# Patient Record
Sex: Male | Born: 1940 | Race: Black or African American | Hispanic: No | Smoking: Former smoker
Health system: Southern US, Community
[De-identification: ages and names within clinical notes are randomized; demographics above are authoritative.]

## PROBLEM LIST (undated history)

## (undated) DIAGNOSIS — I10 Essential (primary) hypertension: Secondary | ICD-10-CM

## (undated) HISTORY — DX: Essential (primary) hypertension: I10

---

## 1998-05-31 HISTORY — PX: BRAIN SURGERY: SHX531

## 2008-05-31 DIAGNOSIS — C61 Malignant neoplasm of prostate: Secondary | ICD-10-CM | POA: Insufficient documentation

## 2008-05-31 DIAGNOSIS — R351 Nocturia: Secondary | ICD-10-CM | POA: Insufficient documentation

## 2008-05-31 DIAGNOSIS — R3912 Poor urinary stream: Secondary | ICD-10-CM | POA: Insufficient documentation

## 2008-05-31 HISTORY — DX: Malignant neoplasm of prostate: C61

## 2017-05-31 HISTORY — PX: CATARACT EXTRACTION: SUR2

## 2019-05-11 ENCOUNTER — Ambulatory Visit (INDEPENDENT_AMBULATORY_CARE_PROVIDER_SITE_OTHER): Payer: Medicare Other | Admitting: Family Medicine

## 2019-05-11 ENCOUNTER — Encounter (INDEPENDENT_AMBULATORY_CARE_PROVIDER_SITE_OTHER): Payer: Self-pay | Admitting: Family Medicine

## 2019-05-11 VITALS — BP 121/73 | HR 88 | Temp 97.2°F | Ht 64.8 in | Wt 167.4 lb

## 2019-05-11 DIAGNOSIS — R5383 Other fatigue: Secondary | ICD-10-CM

## 2019-05-11 DIAGNOSIS — Z8546 Personal history of malignant neoplasm of prostate: Secondary | ICD-10-CM

## 2019-05-11 DIAGNOSIS — Z1211 Encounter for screening for malignant neoplasm of colon: Secondary | ICD-10-CM

## 2019-05-11 DIAGNOSIS — H9193 Unspecified hearing loss, bilateral: Secondary | ICD-10-CM

## 2019-05-11 DIAGNOSIS — Z1212 Encounter for screening for malignant neoplasm of rectum: Secondary | ICD-10-CM

## 2019-05-11 DIAGNOSIS — R2689 Other abnormalities of gait and mobility: Secondary | ICD-10-CM

## 2019-05-11 DIAGNOSIS — E559 Vitamin D deficiency, unspecified: Secondary | ICD-10-CM

## 2019-05-11 DIAGNOSIS — Z23 Encounter for immunization: Secondary | ICD-10-CM

## 2019-05-11 DIAGNOSIS — I10 Essential (primary) hypertension: Secondary | ICD-10-CM

## 2019-05-11 DIAGNOSIS — H259 Unspecified age-related cataract: Secondary | ICD-10-CM

## 2019-05-11 DIAGNOSIS — R739 Hyperglycemia, unspecified: Secondary | ICD-10-CM

## 2019-05-11 LAB — CBC AND DIFFERENTIAL
Absolute NRBC: 0 10*3/uL (ref 0.00–0.00)
Basophils Absolute Automated: 0.03 10*3/uL (ref 0.00–0.08)
Basophils Automated: 0.7 %
Eosinophils Absolute Automated: 0.05 10*3/uL (ref 0.00–0.44)
Eosinophils Automated: 1.1 %
Hematocrit: 41.2 % (ref 37.6–49.6)
Hgb: 13.3 g/dL (ref 12.5–17.1)
Immature Granulocytes Absolute: 0.02 10*3/uL (ref 0.00–0.07)
Immature Granulocytes: 0.4 %
Lymphocytes Absolute Automated: 1.8 10*3/uL (ref 0.42–3.22)
Lymphocytes Automated: 39.5 %
MCH: 30.2 pg (ref 25.1–33.5)
MCHC: 32.3 g/dL (ref 31.5–35.8)
MCV: 93.4 fL (ref 78.0–96.0)
MPV: 9.2 fL (ref 8.9–12.5)
Monocytes Absolute Automated: 0.48 10*3/uL (ref 0.21–0.85)
Monocytes: 10.5 %
Neutrophils Absolute: 2.18 10*3/uL (ref 1.10–6.33)
Neutrophils: 47.8 %
Nucleated RBC: 0 /100 WBC (ref 0.0–0.0)
Platelets: 228 10*3/uL (ref 142–346)
RBC: 4.41 10*6/uL (ref 4.20–5.90)
RDW: 13 % (ref 11–15)
WBC: 4.56 10*3/uL (ref 3.10–9.50)

## 2019-05-11 LAB — COMPREHENSIVE METABOLIC PANEL
ALT: 22 U/L (ref 0–55)
AST (SGOT): 24 U/L (ref 5–34)
Albumin/Globulin Ratio: 1.2 (ref 0.9–2.2)
Albumin: 4.1 g/dL (ref 3.5–5.0)
Alkaline Phosphatase: 50 U/L (ref 38–106)
Anion Gap: 11 (ref 5.0–15.0)
BUN: 21 mg/dL (ref 9.0–28.0)
Bilirubin, Total: 0.9 mg/dL (ref 0.2–1.2)
CO2: 24 mEq/L (ref 21–29)
Calcium: 9.3 mg/dL (ref 7.9–10.2)
Chloride: 102 mEq/L (ref 100–111)
Creatinine: 1.7 mg/dL — ABNORMAL HIGH (ref 0.5–1.5)
Globulin: 3.5 g/dL (ref 2.0–3.7)
Glucose: 100 mg/dL (ref 70–100)
Potassium: 3.8 mEq/L (ref 3.5–5.1)
Protein, Total: 7.6 g/dL (ref 6.0–8.3)
Sodium: 137 mEq/L (ref 136–145)

## 2019-05-11 LAB — HEMOGLOBIN A1C
Average Estimated Glucose: 119.8 mg/dL
Hemoglobin A1C: 5.8 % (ref 4.6–5.9)

## 2019-05-11 LAB — TSH: TSH: 0.23 u[IU]/mL — ABNORMAL LOW (ref 0.35–4.94)

## 2019-05-11 LAB — PSA: Prostate Specific Antigen, Total: 3.217 ng/mL (ref 0.000–4.000)

## 2019-05-11 LAB — HEMOLYSIS INDEX: Hemolysis Index: 14 (ref 0–18)

## 2019-05-11 LAB — GFR: EGFR: 39.1

## 2019-05-11 LAB — VITAMIN D,25 OH,TOTAL: Vitamin D, 25 OH, Total: 70 ng/mL (ref 30–100)

## 2019-05-11 LAB — VITAMIN B12: Vitamin B-12: 580 pg/mL (ref 211–911)

## 2019-05-11 NOTE — Progress Notes (Signed)
Have you seen any specialists/other providers since your last visit with [place]?    No    Do you agree to telemedicine visit?  No    Arm preference verified?   Yes, no preference    Health Maintenance Due   Topic Date Due   . Pneumonia Vaccine Age 78+ (1 of 2 - PCV13) 07/28/2005

## 2019-05-11 NOTE — Progress Notes (Signed)
Subjective:      Date: 05/11/2019 3:40 PM   Patient ID: Tanner Lewis is a 78 y.o. male.    Chief Complaint:  Chief Complaint   Patient presents with   . Establish Care     Needs a referral for colonoscopy, Last Colonospy: Pt. stated it was more than 10 years ago.   . Fatigue     x3 months.   . Polyuria     Frequent urination at night.Pt. denies excessive thrist and weight loss.   [name] Hearing Problem     Pt. would like an OTC medication that can aid with hearing.   . Establish Care     Needs a referral for urologist.       HPI:  HPI     Pt is a 78 years old male who presents to establish care. His past medical history if significant for HTN, prostate cancer ( status post treatment), balance difficulty.    Today he complains for polyuria, with increase thrist and weight loss.     Also complains of recent difficulties with hearing.     Complaints of fatigue today. Feels like he does not have the energy to do his daily activities as he use to.     Problem List:  Patient Active Problem List   Diagnosis   . Essential hypertension   . History of prostate cancer   . Fatigue, unspecified type   . Keeps losing balance   . Age-related cataract of both eyes, unspecified age-related cataract type   . 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 amlodipine-benazepril (LOTREL) 10-20 MG per capsule Take 1 capsule by mouth daily Take one tablet by mouth once daily.     [name] Apoaequorin (Prevagen) 10 MG Cap Take by mouth One capsule once daily.     . hydrALAZINE (APRESOLINE) 50 MG tablet Take 50 mg by mouth 3 (three) times daily Take 1 tablet by mouth three times daily     . isosorbide dinitrate (ISORDIL) 20 MG tablet Take 20 mg by mouth 3 (three) times daily Take 1 tablet by mouth three times daily.     . Misc Natural Products (PROSTATE SUPPORT PO) Take by mouth Twice daily.     [name] oxybutynin (DITROPAN) 5 MG tablet Take 5 mg by mouth 3 (three)  times daily Take one tablet by mouth once dauly     . simvastatin (ZOCOR) 20 MG tablet Take 20 mg by mouth nightly Take one tablet by mouth at bedtime     . tamsulosin (FLOMAX) 0.4 MG Cap Take 0.4 mg by mouth Daily after dinner Take 1 capsule by mouth daily.     [name] venlafaxine (EFFEXOR) 75 MG tablet Take 75 mg by mouth 2 (two) times daily Take 1 tablet by mouth at bedtime.       No current facility-administered medications for this visit.        Allergies:  Allergies   Allergen Reactions   . Solarcaine [Benzocaine] Rash     Happened over 5 years ago.       Past Medical History:  Past Medical History:   Diagnosis Date   . Hypertension    . Prostate cancer 2010       ROS:  Review of Systems   ROS:  General/Constitutional:           Denies Chills.  Denies  Fatigue.  Denies Fever.           ENT:           Positive for decrease hearing  Endocrine:           Positive for polyuria,   Respiratory:           Denies Cough.  Denies Orthopnea.  Denies Shortness of breath.  Denies Wheezing.       Cardiovascular:           Denies Chest pain.  Denies Chest pain with exertion.  Denies Palpitations.  Denies Swelling in hands/feet.       Genitourinary:           Denies Blood in urine.  Denies Painful urination.  Positive for frequency  Peripheral Vascular:           Denies Pain/cramping in legs after exertion.  Denies Painful extremities.       MSK:         Denies: Joint pain, weakness, falls, muscle pain. Admits to poor balance    Objective:     Vitals:  BP 121/73 (BP Site: Left arm, Patient Position: Sitting, Cuff Size: Medium)   Pulse 88   Temp 97.2 F (36.2 C) (Temporal)   Ht 1.646 m (5' 4.8")   Wt 75.9 kg (167 lb 6.4 oz)   BMI 28.03 kg/m     Physical Exam:  Physical Exam    General Examination:  GENERAL APPEARANCE: alert, in no acute distress, well developed, well nourished, oriented to time, place, and person. Elderly gentleman accompanied by his daughter  HEAD: normal appearance.   EYES: extraocular movement intact (EOMI)    EARS: tympanic membranes normal bilaterally, external canals normal .   NOSE: normal nasal mucosa, nares patent.   ORAL CAVITY: normal oropharynx.   HEART: S1, S2 normal, no murmurs, rubs, gallops, regular rate and rhythm.   LUNGS: normal effort / no distress, normal breath sounds, clear to auscultation bilaterally, no wheezes, rales, rhonchi.   ABDOMEN: bowel sounds present, no hepatosplenomegaly, soft, nontender,  EXTREMITIES: no clubbing, cyanosis, or edema.   MSK: Muscle strength 5/5  Neuro: No deficits noted      Assessment/Plan:   1. Essential hypertension  -New to me, but reports chronic medical condition. Will continue to monitor    2. Screening for colorectal cancer  -Age approciate screening. Discussed risked and benefits given advance age. He will like to proceed with colonscopy and intervention as needed  - Gastroenterology Referral: Joanne Gavel, MD [facility]. [facility] [place])    3. History of prostate cancer  - Status post prostate seed treatment.   - PSA  - Urology Referral: Madlyn Frankel, MD [facility])    4. Fatigue, unspecified type  - unknown etiology, investigate with labs below.   - Comprehensive metabolic panel; Future  - TSH; Future  - CBC and differential; Future  - Testosterone Free and Total  - Comprehensive metabolic panel  - TSH  - CBC and differential    5. Keeps losing balance  - Poor coordination with neuro defits. Refer to PT for balance  - Vitamin B12  - Ambulatory referral to Physical Therapy    6. Age-related cataract of both eyes, unspecified age-related cataract type  - Reported poor vision. Ophthalmology for cataract treatment  - Ophthalmology Referral: Tawni Millers, MD [facility])    7. Vitamin D deficiency  - Prior history of. Repeat labs today  - Vitamin D,25 OH, Total; Future  - Vitamin  D,25 [place], Total    8. Hyperglycemia  -Screen for DM due to hypergylcemia on labs  - Hemoglobin A1C    9. Encounter for vaccination  Age appropiate vaccination  - Pneumococcal conjugate vaccine  13-valent  - Zoster Vaccine Recomb,Adjuvanted (IM)    10. 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

## 2019-05-14 ENCOUNTER — Encounter (INDEPENDENT_AMBULATORY_CARE_PROVIDER_SITE_OTHER): Payer: Self-pay | Admitting: Cardiovascular Disease

## 2019-05-14 ENCOUNTER — Ambulatory Visit (INDEPENDENT_AMBULATORY_CARE_PROVIDER_SITE_OTHER): Payer: Medicare Other | Admitting: Cardiovascular Disease

## 2019-05-14 VITALS — BP 143/84 | HR 69 | Temp 96.4°F | Resp 19 | Ht 64.0 in | Wt 171.0 lb

## 2019-05-14 DIAGNOSIS — I10 Essential (primary) hypertension: Secondary | ICD-10-CM

## 2019-05-14 NOTE — Progress Notes (Signed)
[place] [facility] Consult      Assessment and Plan     78 yo male relocated to [place] from [place] and here to establish care.      1. Hypertension  2.  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  No issues since.  3. Prostate cancer -- he was treated with radiation, 10 years ago treated; follows with urology  4. CKD Cr 1.7 (taken off of Lisinopril as a result by PCP)    Recommendations:  Please send records from [place]    History of Present Illness     [name] is a 78 year old gentleman who is living with his daughter who relocated to the area from [place] very recently and is looking to establish cardiovascular care.  He has a history of hypertension and prostate cancer.  He is on Lotrel 10-20 mg, hydralazine 50 mg 3 times daily, Isordil 20 mg 2 times daily, and he takes Zocor 20 mg once a day.  Blood pressure is a little elevated today.  He reports he underwent cardiovascular testing many years ago.  Seen by PCP recently (05/11/2019) and had labs drawn.  Recently, pt's Lisinopril was d/c'd presumably due to Cr of 1.7 without known baseline value.    He denies any cardiovascular symptoms today.      Exercise tolerance is quite good.  He could walk one mile.  He could walk one flight of steps.  Takes care of all activities of daily living.     No history of heart failure    No history of myocardial infarction.    EKG shows normal sinus rhythm normal intervals narrow QRS, no pathologic Q waves    He was seen last by cardiologist (daughter and pt do not remember name) in [place] in Sept 2020.  No issues at that visit.  He was taking isosorbide TID and taking now BID was the only change.  [name] is his daughter who helps with the history.    He reports a history of syncope about 3 months ago.  He was at [employer].  He looked "different" per his daughter.  EMS came and said his BP was low.  Event occurred after taking most of his BP meds and having a beer.   EKG was normal.  Was not taken to ER.  No further  events reported.  No prior events reported.    Denies PND, orthopnea, palpitations.    Denies claudication symptoms.      Past Medical History     Past Medical History:   Diagnosis Date   . Hypertension    . Prostate cancer 2010       Past Surgical History     Past Surgical History:   Procedure Laterality Date   . BRAIN SURGERY  2000   . CATARACT EXTRACTION  2019    Both eyes        Family History     Family History   Problem Relation Age of Onset   . Diabetes Mother    . Diabetes Brother    . Diabetes Son    . Glaucoma Son        Social History     Social History     Socioeconomic History   . Marital status: Single     Spouse name: Not on file   . Number of children: Not on file   . Years of education: Not on file   . Highest education level: Not on  file   Occupational History   . Not on file   Social Needs   . Financial resource strain: Not on file   . Food insecurity     Worry: Not on file     Inability: Not on file   . Transportation needs     Medical: Not on file     Non-medical: Not on file   Tobacco Use   . Smoking status: Former Smoker     Packs/day: 1.50     Years: 20.00     Pack years: 30.00     Types: Cigarettes     Quit date: 05/10/2014     Years since quitting: 5.0   . Smokeless tobacco: Never Used   Substance and Sexual Activity   . Alcohol use: Yes     Alcohol/week: 8.0 standard drinks     Types: 2 Glasses of wine, 3 Cans of beer, 3 Shots of liquor per week     Comment: Ocasionally, not done very often   . Drug use: Never   . Sexual activity: Not Currently     Partners: Female     Birth control/protection: Condom   Lifestyle   . Physical activity     Days per week: Not on file     Minutes per session: Not on file   . Stress: Not on file   Relationships   . Social Wellsite geologist on phone: Not on file     Gets together: Not on file     Attends religious service: Not on file     Active member of club or organization: Not on file     Attends meetings of clubs or organizations: Not on file      Relationship status: Not on file   . Intimate partner violence     Fear of current or ex partner: Not on file     Emotionally abused: Not on file     Physically abused: Not on file     Forced sexual activity: Not on file   Other Topics Concern   . Not on file   Social History Narrative   . Not on file       Allergies     Allergies   Allergen Reactions   . Solarcaine [Benzocaine] Rash     Happened over 5 years ago.       Medications     Current Outpatient Medications   Medication Sig Dispense Refill   . amlodipine-benazepril (LOTREL) 10-20 MG per capsule Take 1 capsule by mouth daily Take one tablet by mouth once daily.     [name] Apoaequorin (Prevagen) 10 MG Cap Take by mouth One capsule once daily.     . hydrALAZINE (APRESOLINE) 50 MG tablet Take 50 mg by mouth 3 (three) times daily Take 1 tablet by mouth three times daily     . isosorbide dinitrate (ISORDIL) 20 MG tablet Take 20 mg by mouth 3 (three) times daily Take 1 tablet by mouth three times daily.     . Misc Natural Products (PROSTATE SUPPORT PO) Take by mouth Twice daily.     [name] oxybutynin (DITROPAN) 5 MG tablet Take 5 mg by mouth 3 (three) times daily Take one tablet by mouth once dauly     . simvastatin (ZOCOR) 20 MG tablet Take 20 mg by mouth nightly Take one tablet by mouth at bedtime     . tamsulosin (FLOMAX) 0.4 MG Cap Take 0.4 mg by  mouth Daily after dinner Take 1 capsule by mouth daily.     [name] venlafaxine (EFFEXOR) 75 MG tablet Take 75 mg by mouth 2 (two) times daily Take 1 tablet by mouth at bedtime.       No current facility-administered medications for this visit.          Review of Systems     Constitutional: Negative for fevers and chills  Skin: No rash or lesions  Respiratory: Negative for cough, wheezing, or hemoptysis  Cardiovascular: as per HPI  Gastrointestinal: Negative for abdominal pain, nausea, vomiting and diarrhea  Musculoskeletal:  No arthritic symptoms  Genitourinary: Negative for dysuria  Otherwise 10 point review of systems is  negative.      Physical Exam   BP 143/84 (BP Site: Left arm, Patient Position: Sitting)   Pulse 69   Temp (!) 96.4 F (35.8 C)   Resp 19   Ht 1.626 m (5\' 4" )   Wt 77.6 kg (171 lb)   SpO2 98%   BMI 29.35 kg/m     There is no height or weight on file to calculate BMI.    General:  Patient appears their stated age, well-nourished.  Alert and in no apparent distress.  Eyes: No conjunctivitis, no purulent discharge, no lid lag  ENT:  Hearing grossly intact, Nares patent bilaterally, Lips moist, color appropriate for race.  Respiratory: Clear to auscultation and percussion throughout. Respiratory effort unlabored, chest expansion symmetric.    Cardio: Regular rate and rhythm. Normal S1/S2 No carotid bruits or thrills, no JVD.  Extremities: warm, pulses 2+, no peripheral edema  GI: Soft, nondistended, nontender.  No guarding or rebound.  Skin: Color appropriate for race, Skin warm, dry, and intact  Psychiatric: Good insight and judgment, oriented to person, place, and time    Labs     Lipid Panel No results found for: CHOL, TRIG, HDL  CBC   Lab Results   Component Value Date    WBC 4.56 05/11/2019    HGB 13.3 05/11/2019    HCT 41.2 05/11/2019    MCV 93.4 05/11/2019    PLT 228 05/11/2019      BMP  Lab Results   Component Value Date    CO2 24 05/11/2019    BUN 21.0 05/11/2019     INR No results found for: INR, PROTIME     EKG   I have reviewed and interpreted the EKG.  The EKG is significant for NSR, normal intervals.          I spent 45 mins with the patient, >50% of the time was spent on education and counseling.      Renard Hamper, MD, MS    [facility] Electrophysiology

## 2019-05-15 ENCOUNTER — Encounter (INDEPENDENT_AMBULATORY_CARE_PROVIDER_SITE_OTHER): Payer: Self-pay | Admitting: Cardiovascular Disease

## 2019-05-17 LAB — TESTOSTERONE, FREE, TOTAL
Free Testosterone: 1.68 ng/dL — ABNORMAL LOW (ref 3.08–11.3)
Total Testosterone: 129 ng/dL — ABNORMAL LOW (ref 240–950)

## 2019-05-21 ENCOUNTER — Encounter (INDEPENDENT_AMBULATORY_CARE_PROVIDER_SITE_OTHER): Payer: Self-pay | Admitting: Family Medicine

## 2019-05-21 DIAGNOSIS — R2689 Other abnormalities of gait and mobility: Secondary | ICD-10-CM | POA: Insufficient documentation

## 2019-05-21 DIAGNOSIS — H9193 Unspecified hearing loss, bilateral: Secondary | ICD-10-CM | POA: Insufficient documentation

## 2019-05-21 DIAGNOSIS — I10 Essential (primary) hypertension: Secondary | ICD-10-CM | POA: Insufficient documentation

## 2019-05-21 DIAGNOSIS — R5383 Other fatigue: Secondary | ICD-10-CM | POA: Insufficient documentation

## 2019-05-21 DIAGNOSIS — Z8546 Personal history of malignant neoplasm of prostate: Secondary | ICD-10-CM | POA: Insufficient documentation

## 2019-05-21 DIAGNOSIS — H259 Unspecified age-related cataract: Secondary | ICD-10-CM | POA: Insufficient documentation

## 2019-05-22 ENCOUNTER — Telehealth (INDEPENDENT_AMBULATORY_CARE_PROVIDER_SITE_OTHER): Payer: Medicare Other | Admitting: Family Medicine

## 2019-05-22 DIAGNOSIS — N1832 Chronic kidney disease, stage 3b: Secondary | ICD-10-CM

## 2019-05-22 DIAGNOSIS — E079 Disorder of thyroid, unspecified: Secondary | ICD-10-CM

## 2019-05-22 DIAGNOSIS — R7989 Other specified abnormal findings of blood chemistry: Secondary | ICD-10-CM

## 2019-05-22 DIAGNOSIS — R7303 Prediabetes: Secondary | ICD-10-CM

## 2019-05-22 DIAGNOSIS — R5383 Other fatigue: Secondary | ICD-10-CM

## 2019-05-22 NOTE — Progress Notes (Signed)
Subjective:      Patient ID: Tanner Lewis  is a 78 y.o.  male.     No chief complaint on file.       Verbal consent has been obtained from the patient to conduct a Video visit encounter to minimize exposure to COVID-19: yes    Telemedicine Documentation Requirements    Provider and Title: Elray Buba. D.O  Consent obtained: YES/NO: Yes  Language, if applicable and if translator was required: English      HPI   Presents for laboratory follow-up following his health maintenance examination.  Labs showed that he had low testosterone, low TSH, and hemoglobin A1c in the prediabetic range.  And a decreased GFR    Today he reports that he is doing well.  Still has mild fatigue.  He is currently in [place] celebrating Christmas with his family.  He will return to his area in 1 month.  Has no acute complaints.  Has been able to follow-up with hearing exam for his decreased hearing.  He also follows up with cardiology.  Pending ophthalmology visit for his cataracts.      The following portions of the patient's history were reviewed and updated as appropriate: current medications, allergies, past family history, past medical history, past social history, past surgical history and problem list.     Past Medical History:   Diagnosis Date   . Hypertension    . Prostate cancer 2010       Patient Active Problem List   Diagnosis   . Essential hypertension   . History of prostate cancer   . Fatigue, unspecified type   . Keeps losing balance   . Age-related cataract of both eyes, unspecified age-related cataract type   . 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 Solarcaine [Benzocaine] Rash     Happened over 5 years ago.         Current Outpatient Medications:   .  amlodipine-benazepril (LOTREL) 10-20 MG per capsule, Take 1 capsule by mouth daily Take one tablet by mouth once daily., Disp: , Rfl:   .  Apoaequorin (Prevagen) 10 MG Cap, Take by mouth One capsule once daily., Disp: ,  Rfl:   .  hydrALAZINE (APRESOLINE) 50 MG tablet, Take 50 mg by mouth 3 (three) times daily Take 1 tablet by mouth three times daily, Disp: , Rfl:   .  isosorbide dinitrate (ISORDIL) 20 MG tablet, Take 20 mg by mouth 3 (three) times daily Take 1 tablet by mouth three times daily., Disp: , Rfl:   .  Misc Natural Products (PROSTATE SUPPORT PO), Take by mouth Twice daily., Disp: , Rfl:   .  oxybutynin (DITROPAN) 5 MG tablet, Take 5 mg by mouth 3 (three) times daily Take one tablet by mouth once dauly, Disp: , Rfl:   .  simvastatin (ZOCOR) 20 MG tablet, Take 20 mg by mouth nightly Take one tablet by mouth at bedtime, Disp: , Rfl:   .  tamsulosin (FLOMAX) 0.4 MG Cap, Take 0.4 mg by mouth Daily after dinner Take 1 capsule by mouth daily., Disp: , Rfl:   .  venlafaxine (EFFEXOR) 75 MG tablet, Take 75 mg by mouth 2 (two) times daily Take 1 tablet by mouth at bedtime., Disp: , Rfl:     Review of Systems   Constitutional: Positive for fatigue. Negative for fever.   Respiratory: Negative for stridor.    Cardiovascular:  Negative for chest pain, palpitations and leg swelling.   Gastrointestinal: Negative for abdominal distention and abdominal pain.   Genitourinary: Negative for difficulty urinating.   Musculoskeletal: Negative for arthralgias, back pain and gait problem.   All other systems reviewed and are negative.            Objective:   No Vitals, Video Visit  No LMP for male patient.    Physical Exam   Constitutional:   Pleasant elderly gentleman, accompanied by his daughters.   Neck: Normal range of motion.   Cardiovascular: Normal rate.   Pulmonary/Chest: No respiratory distress. He has no wheezes.   Genitourinary:    Penis normal.     Musculoskeletal: Normal range of motion.   Neurological: He is alert.          Assessment and Plan:   1. Other fatigue  -Etiology unknown.  Differential diagnosis include hypofunctioning thyroid, low testosterone.  Will investigate thyroid labs.  For the time being due to adverse effects  from testosterone replacement patient has elected not to replace testosterone.  We will continue monitor.  Encouraged proper diet and exercise to improve his energy and overall health.    2. Low TSH level  Noted low TSH.  Will investigate with free T4 and T3 to rule out hypo and hyperthyroidism  - T4, free  - T3, free    3. Thyroid disease   -Noted low TSH.  Will investigate with free T4 and T3 to rule out hypo and hyperthyroidism  - T4, free    4. Stage 3b chronic kidney disease  -Noted on exam.  No priors for comparison.  Will repeat in 3 months.  If continues to decline will investigate with ultrasound and referral to nephrology.    5. Pre-diabetes  -Hemoglobin A1c of 5.8.  Counseled on diet and exercise.  Repeat in 3 months.    6. Low testosterone  -Noted on labs.  Due to patient's age and adverse effects of testosterone replacement I have elected not to proceed with testosterone replacement for now.  Continue monitor.      Time spent in medical discussion: 11 to 20 minutes        Risk & Benefits of any previous or new medication(s) were explained to the patient who verbalized understanding & agreed to the treatment plan.     Call with updates/questions/concerns.

## 2019-05-23 ENCOUNTER — Encounter (INDEPENDENT_AMBULATORY_CARE_PROVIDER_SITE_OTHER): Payer: Self-pay | Admitting: Family Medicine

## 2019-07-30 ENCOUNTER — Telehealth (INDEPENDENT_AMBULATORY_CARE_PROVIDER_SITE_OTHER): Payer: Self-pay

## 2019-07-30 ENCOUNTER — Telehealth (INDEPENDENT_AMBULATORY_CARE_PROVIDER_SITE_OTHER): Payer: Medicare Other | Admitting: Urology

## 2019-07-30 ENCOUNTER — Encounter (INDEPENDENT_AMBULATORY_CARE_PROVIDER_SITE_OTHER): Payer: Self-pay | Admitting: Urology

## 2019-07-30 DIAGNOSIS — C61 Malignant neoplasm of prostate: Secondary | ICD-10-CM

## 2019-07-30 DIAGNOSIS — R3912 Poor urinary stream: Secondary | ICD-10-CM

## 2019-07-30 DIAGNOSIS — R351 Nocturia: Secondary | ICD-10-CM

## 2019-07-30 NOTE — Telephone Encounter (Signed)
Attempted to contact pt prior to video visit this afternoon. Unable to leave VM, mailbox full.

## 2019-07-30 NOTE — Patient Instructions (Signed)
Today we reviewed a plan for evaluating your worsening urinary symptoms    For now, please continue your oxybutynin and Flomax medications    Please obtain prior records, so that I can review old PSA results, as well as the details of your prior diagnosis and treatment of prostate cancer    Please have a kidney and bladder ultrasound performed  Call [facility] at [phone] to schedule radiology test(s).    Follow-up for cystoscopy in the next few weeks

## 2019-07-30 NOTE — Progress Notes (Signed)
Subjective:      Patient ID: Tanner Lewis is a 79 y.o. male     Verbal consent has been obtained from the patient to conduct a telephone-video visit encounter to minimize exposure to COVID-19: yes    Chief Complaint:  1) prostate cancer  2) nocturia  3) weak stream    This is a pleasant 79yoM.  He is accompanied by one of his daughters for this video visit.  He generally switches between living in [place] and living in [place], where he lives with one of his daughters.  He has a history of prostate cancer, this was diagnosed approximately in 2010 and he was treated in [place] with brachytherapy.  He states he has been told he has no prostate cancer recurrence.  He is unaware of most recent PSA values, but has had a new value checked December 2020, and the value is 3.2 ng/mL.  He has been having worsening urinary symptoms.  He has nocturia 5-6 times per night, and weak stream and urinary frequency throughout the day.  Denies hematuria, dysuria, flank pain.  He does have incontinence, and wears Depends underwear throughout the day for this.  Currently taking oxybutynin 5 mg TID, and Flomax 0.4 mg daily.      The following portions of the patient's history were reviewed and updated as appropriate: allergies, current medications, past family history, past medical history, past social history, past surgical history and problem list.    Review of Systems  History obtained from the patient  General ROS: negative for - chills, fatigue, fever or weight loss  Psychological ROS: negative for - depression, disorientation or memory difficulties  Ophthalmic ROS: negative for - blurry vision or loss of vision  Hematological and Lymphatic ROS: negative for - bleeding problems, night sweats or swollen lymph nodes  Endocrine ROS: negative for - malaise/lethargy, polydipsia/polyuria or skin changes  Respiratory ROS: no cough, shortness of breath, or wheezing  Cardiovascular ROS: no chest pain or dyspnea on exertion  Gastrointestinal  ROS: no abdominal pain, change in bowel habits, or black or bloody stools  Genito-Urinary ROS: no dysuria, trouble voiding, or hematuria  Musculoskeletal ROS: negative for - joint pain or muscle pain  Neurological ROS: negative for - headaches, impaired coordination/balance or numbness/tingling      Objective:   General appearance - alert, well appearing, and in no distress  Mental status - alert, oriented to person, place, and time, appropriate affect  Skin: no rash or ulcers on face  Head - Normocephalic, Atraumatic, EOMI  Chest - No audible wheezes or rales        Lab Review   I have personally reviewed the following laboratory results and my interpretation is:      Lab Results   Component Value Date    PSA 3.217 05/11/2019         Radiology Review   I have personally reviewed the following imaging results and my interpretation is:    none    Assessment:     1. 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 Nocturia    3. Weak urinary stream        Discussed with him that given his age, given his prior history of prostate cancer, and given the fact that he is already on medications for an enlarged prostate as well as bladder overactivity, I recommend further evaluation rather than proceeding straight to additional empiric treatment.  I have requested that they obtain  records of prior PSAs as well as details of his prior cancer diagnosis, and his daughter indicated that she would obtain these records.  I have provided an order for a renal bladder ultrasound, specifically to make certain there are no issues related incomplete bladder emptying, no issues with urolithiasis, and no other concerning abnormalities.  I also recommended cystoscopy to evaluate for urethral stricture, bladder outlet obstruction, or any other conditions that could be impacting his symptoms.  They are agreeable.       Plan:   Patient Instructions   Today we reviewed a plan for evaluating your worsening urinary  symptoms    For now, please continue your oxybutynin and Flomax medications    Please obtain prior records, so that I can review old PSA results, as well as the details of your prior diagnosis and treatment of prostate cancer    Please have a kidney and bladder ultrasound performed  Call [facility] at [phone] to schedule radiology test(s).    Follow-up for cystoscopy in the next few weeks          Orders  Orders Placed This Encounter   Procedures   . US Renal Kidney Bladder Complete     Standing Status:   Future     Number of Occurrences:   1     Standing Expiration Date:   07/29/2020     Order Specific Question:   Clinical info for radiologist     Answer:   prostate cancer history, now more nocturia and frequency, assess bladder emptying

## 2019-07-30 NOTE — Telephone Encounter (Signed)
Attempted to contact pt prior to video visit this afternoon. There was no answer, unable to leave VM mailbox full.

## 2019-08-02 ENCOUNTER — Other Ambulatory Visit (INDEPENDENT_AMBULATORY_CARE_PROVIDER_SITE_OTHER): Payer: Self-pay | Admitting: Family Medicine

## 2019-08-06 ENCOUNTER — Other Ambulatory Visit (INDEPENDENT_AMBULATORY_CARE_PROVIDER_SITE_OTHER): Payer: Self-pay | Admitting: Family Medicine

## 2019-08-06 ENCOUNTER — Encounter (INDEPENDENT_AMBULATORY_CARE_PROVIDER_SITE_OTHER): Payer: Self-pay

## 2019-08-06 ENCOUNTER — Other Ambulatory Visit (FREE_STANDING_LABORATORY_FACILITY): Payer: Medicare Other

## 2019-08-06 DIAGNOSIS — R7303 Prediabetes: Secondary | ICD-10-CM

## 2019-08-06 DIAGNOSIS — R7989 Other specified abnormal findings of blood chemistry: Secondary | ICD-10-CM

## 2019-08-06 LAB — THYROID STIMULATING HORMONE (TSH), REFLEX ON ABNORMAL TO FREE T4, SERUM: TSH, Abn Reflex to Free T4, Serum: 0.3 u[IU]/mL — ABNORMAL LOW (ref 0.35–4.94)

## 2019-08-09 ENCOUNTER — Ambulatory Visit (INDEPENDENT_AMBULATORY_CARE_PROVIDER_SITE_OTHER): Payer: Medicare Other | Admitting: Urology

## 2019-08-09 ENCOUNTER — Encounter (INDEPENDENT_AMBULATORY_CARE_PROVIDER_SITE_OTHER): Payer: Self-pay | Admitting: Urology

## 2019-08-09 DIAGNOSIS — R3912 Poor urinary stream: Secondary | ICD-10-CM

## 2019-08-09 DIAGNOSIS — Z8546 Personal history of malignant neoplasm of prostate: Secondary | ICD-10-CM

## 2019-08-09 DIAGNOSIS — C61 Malignant neoplasm of prostate: Secondary | ICD-10-CM

## 2019-08-09 DIAGNOSIS — N400 Enlarged prostate without lower urinary tract symptoms: Secondary | ICD-10-CM

## 2019-08-09 DIAGNOSIS — N3289 Other specified disorders of bladder: Secondary | ICD-10-CM

## 2019-08-09 DIAGNOSIS — R351 Nocturia: Secondary | ICD-10-CM

## 2019-08-09 LAB — URINALYSIS POC
Blood, UA POCT: NEGATIVE
POCT Urine Bilirubin: NEGATIVE
POCT Urine Glucose: NEGATIVE mg/dL
POCT Urine Ketones: NEGATIVE mg/dL
POCT Urine Nitrites: NEGATIVE
POCT Urine Urobilibogen: 0.2 mg/dL (ref 0.0–1.0)
POCT Urine pH: 7 (ref 5.0–8.0)
Protein, UR POCT: 100 mg/dL — AB
Urine Specific Gravity POC: 1.02 (ref 1.001–1.035)
Urine leukocyte Esterase, POCT: NEGATIVE

## 2019-08-09 MED ORDER — MIRABEGRON ER 50 MG PO TB24
50.00 mg | ORAL_TABLET | Freq: Every day | ORAL | 3 refills | Status: DC
Start: 2019-08-09 — End: 2019-10-01

## 2019-08-09 NOTE — Patient Instructions (Signed)
Today we reviewed the recent ultrasound results.  The bladder empties well with urination, which is great.    There is a small cyst within the right kidney, and I recommend we keep an eye on this with new imaging in 1 year    Regarding your prostate cancer, I can see that the PSA has started to increase again.  The most recent value was in the threes.  When hormone therapy was stopped around 2019, the value was in the 1s    I am providing a referral to be evaluated by a medical oncologist, to discuss consideration of timing for resuming hormonal treatment for the prostate cancer    Today's cystoscopy was very normal, there was no scar tissue identified in the urethra, and there is no significant enlargement of the prostate  I suspect the urinary frequency symptoms are related to some persistent overactive bladder    Please continue the Flomax medication nightly  Please continue the oxybutynin 5 mg 3 times daily    Please start Myrbetriq 50 mg once daily  Please monitor the blood pressure while on this medication.  If the blood pressure is noted to rise, please stop the medication and let us know    Follow-up with a video visit in 2 months

## 2019-08-09 NOTE — Procedures (Signed)
Male Cystourethroscopy    Prior to the start of the procedure, informed consent was obtained, and a time out was performed.     The patient was placed in the supine position after which the 18 French flexible cystoscope was passed into the urethral meatus with sterile lubricant jelly. The urethra was visualized upon entering. The prostatic urethra was viewed from the veru. The bladder was then entered and filled with adequate fluid to allow good visualization. The lateral, posterior and anterior walls and dome were inspected. The ureteral orifices were identified, with efflux evaluated, and the trigone and bladder neck were also visualized. The cystoscope was then removed while viewing the prostatic urethra and penile urethra in antegrade fashion.    Findings:    Meatus Open    Urethra: Open    Prostate: Slighty enlarged lateral lobes    Bladder: No tumors, lesions, or stones    Mild trabeculation      Trigone: Normal    Orifices: Normally positioned with clear efflux bilaterally      Assessment: normal cystoscopy, no significant BPH or signs of BOO      Notes: RBUS with complex R renal cyst, will follow    PSA was 3.2 in Dec 2020    Prior records show ADT was stopped when PSA was around 1.9 in 2019  Records being scanned today  Had prostate cancer 3+3=6 treated with radiation followed by ADT between 2016-2019.

## 2019-08-10 ENCOUNTER — Encounter (INDEPENDENT_AMBULATORY_CARE_PROVIDER_SITE_OTHER): Payer: Self-pay | Admitting: Urology

## 2019-08-16 ENCOUNTER — Encounter: Payer: Self-pay | Admitting: Hematology & Oncology

## 2019-08-16 ENCOUNTER — Ambulatory Visit: Payer: Medicare Other | Attending: Hematology & Oncology | Admitting: Hematology & Oncology

## 2019-08-16 ENCOUNTER — Other Ambulatory Visit (INDEPENDENT_AMBULATORY_CARE_PROVIDER_SITE_OTHER): Payer: Self-pay | Admitting: Family Medicine

## 2019-08-16 ENCOUNTER — Ambulatory Visit: Payer: Self-pay

## 2019-08-16 ENCOUNTER — Other Ambulatory Visit: Payer: Medicare Other

## 2019-08-16 ENCOUNTER — Encounter (INDEPENDENT_AMBULATORY_CARE_PROVIDER_SITE_OTHER): Payer: Self-pay | Admitting: Family Medicine

## 2019-08-16 VITALS — BP 130/75 | HR 85 | Temp 97.4°F | Resp 16 | Ht 64.0 in | Wt 166.7 lb

## 2019-08-16 DIAGNOSIS — R399 Unspecified symptoms and signs involving the genitourinary system: Secondary | ICD-10-CM | POA: Insufficient documentation

## 2019-08-16 DIAGNOSIS — R7989 Other specified abnormal findings of blood chemistry: Secondary | ICD-10-CM

## 2019-08-16 DIAGNOSIS — C61 Malignant neoplasm of prostate: Secondary | ICD-10-CM | POA: Insufficient documentation

## 2019-08-16 DIAGNOSIS — R946 Abnormal results of thyroid function studies: Secondary | ICD-10-CM

## 2019-08-16 DIAGNOSIS — Z79818 Long term (current) use of other agents affecting estrogen receptors and estrogen levels: Secondary | ICD-10-CM | POA: Insufficient documentation

## 2019-08-16 NOTE — Progress Notes (Signed)
[facility] New Patient Nurse Navigator  08/16/2019    Met with patient following his appointment with Dr. Cathie Hoops. Introduced self and role of nurse navigator. Reviewed plan of care. Plan for re-staging CT and baseline DXA scan. Lab orders in place, the patient states that he has an appointment to have labs drawn at a different [facility] facility tomorrow. Instructed the patient to request they also draw the labs ordered today, including CBC, CMP, PSA, and testosterone. The patient verbalizes understanding. He will follow up in clinic with Dr. Cathie Hoops once the scans are completed. The patient denies any questions at this time. Encouraged him to reach out with any questions or needs.    08/16/2019 2:16 PM  Benedict Needy, RN, BSN, OCN  Oncology Nurse Navigator  [facility]  [phone]  [email or web address]

## 2019-08-16 NOTE — Progress Notes (Signed)
[facility] ONCOLOGY CONSULTATION NOTE    Date of Service: August 16, 2019   Consulting Physician: Festus Barren, MD    Subjective:   Chief Complaint: Tanner Lewis is a 79 y.o. year old male who presents for initial comprehensive evaluation of prostate cancer.     History of Present Illness:  [name] presents today for his initial visit, referred by Dr. Andrey Campanile regarding his history of prostate cancer and rising PSA level. History of present illness dates back to when he was initially diagnosed with prostate cancer 2006 when his PSA was increased to 7 ng/ml and a biopsy confirmed a Gleason 3+3=6 prostate cancer. He was treated with brachytherapy. 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  He and his daughter do not recall any specific details regarding his prostate cancer course, but she does have a large stack of records that she will bring in next time.     To his recollection, the last time he received Lupron was in 2019. He believes his PSA improved in response to Lupron every time he received it. He does not recall ever having a lymph biopsy or repeat prostate biopsy since developing PSA recurrence post brachytherapy. He states that he's always tolerated Lupron well with no side effects other than hot flashes.  He admits to chronic urinary frequency and nocturia (every hour or so) for which Dr. Andrey Campanile recently started him on medication. He had a cystoscopy on 3/11 that was reportedly unremarkable and showed no significant enlargement of his prostate.    Answers for HPI/ROS submitted by the patient on 08/16/2019   Symptoms  Unexpected weight loss: No  Unexpected weight gain: No  fever: No  fatigue: No  Night sweats: No  rash: No  itching: No  blurred vision: No  hearing loss: Yes  Ringing in ears: No  Bleeding gums: No  Sinus pain: No   sore throat: No  Voice change: No  chest pain: No  palpitations: No  difficulty breathing: No  Pain with breathing: No  Chronic cough: No  Bloody sputum: No  wheezing: No  Snoring: No  Appetite change: No  nausea: No  vomiting: No  diarrhea: No  Rectal bleeding: No  Bloody stool: No  constipation: No  heartburn: No  Urinary frequency: Yes  Blood in urine: No  bladder incontinence: Yes  Change in urinary stream: Yes  Difficulty walking: No  headaches: No  numbness: No  tingling: No  dizziness: No  seizures: No  tremors: No  Memory Loss: No  confusion: No  mood changes: No  Depressed mood: No  Difficulty sleeping: No  nervous/anxious: No  Bruise or bleeds easily: No      Oncology History:     Oncology History Overview Note   Adenocarcinoma of the prostate, stage IV (TX N1 M1a)  . Original diagnosis in 2006. Initial PSA was 6.96 ng/ml.   . Prostate biopsy on 02/18/05 confirmed a prostatic adenocarcinoma, Gleason 3+3=6, associated high-grade PIN.  . S/P 125-I seed implant on 04/25/2005.  Tanner Lewis Developed biochemical recurrence, treated with ADT (Casodex/Lupron) from Dec 2011 to March 2013.   Tanner Lewis PSA 2.22 ng/ml on 09/09/2014.  Tanner Lewis Bone scan on 10/08/14 was negative for e/o bone metastases.   . CT abd/pel on 10/08/14 showed "stable bilateral adrenal nodules and multiple prominent retroperitoneal adenopathy with the  largest measuring 1.6 x 1.1 cm in the right iliac chain. There was also a new enlarged lymph node along the right pelvic sidewall measuring 1.4 x 1 cm. The retroperitoneal lymph nodes appear to be stable in appearance. The max dimension of the largest lymph node is 1.6 x 0.8 cm.   . MRI prostate on 11/13/14 demonstrated a local recurrence in the left lobe of the prostate measuring 2.6 x 1.5 x 1.6 cm with deformation of the prostate capsule and likely extracapsular spread. There was a 1.5 x 1.2 cm right iliac chain lymph node, a 1.3 x 1.3 cm common iliac chain lymph node, additional smaller right iliac chain lymph nodes and  right common iliac chain lymph nodes.   . Began ADT with Lupron q6 mo again on 11/25/2014.   Tanner Lewis PSA on 11/25/14 was 33.67 ng/ml; 2.18 ng/ml on 02/05/15; 1.65 in Nov 2016; 1.34 on 05/30/15.   Tanner Lewis Patient was lost to follow-up and did not receive another dose of Lupron since June 2016.  Tanner Lewis CT chest on 07/24/15 negative for e/o metastatic disease.   . Lupron was resumed on 12/21/16 and stopped again sometime in 2019.   Tanner Lewis PSA 1.62 on 06/08/17; 1.4 ng/ml on 06/20/17;   . Established care with local PCP on 05/11/2019 and a PSA was found to be 3.217 ng/ml. Referred to urology for urinary frequency. Renal US on 08/08/2019 was unremarkable.         Past Medical History:  Past Medical History:   Diagnosis Date   . Hypertension    . Prostate cancer 2010       Patient Active Problem List   Diagnosis   . Essential hypertension   . History of prostate cancer   . Fatigue, unspecified type   . Keeps losing balance   . Age-related cataract of both eyes, unspecified age-related cataract type   . 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 Nocturia   . Weak urinary stream       Family History:   Family History   Problem Relation Age of Onset   . Diabetes Mother    . Diabetes Brother    . Diabetes Son    . Glaucoma Son    . Anesthesia problems Neg Hx    . Cancer Neg Hx    . Clotting disorder Neg Hx    . GU problems Neg Hx    . Heart disease Neg Hx    . Hypertension Neg Hx    . Kidney cancer Neg Hx    . Malignant hyperthermia Neg Hx    . Kidney disease Neg Hx    . Prostate cancer Neg Hx    . Sickle cell trait Neg Hx    . Lupus Neg Hx    . Sudden death Neg Hx    . Urolithiasis Neg Hx        Social History:  Social History     Socioeconomic History   . Marital status: Widowed     Spouse name: Not on file   . Number of children: Not on file   . Years of education: Not on file   . Highest education level: Not on file   Occupational History   . Not on file   Tobacco Use   . Smoking  status: Former Smoker     Packs/day: 1.50     Years: 20.00  Pack years: 30.00     Types: Cigarettes     Quit date: 05/10/2014     Years since quitting: 5.2   . Smokeless tobacco: Never Used   Substance and Sexual Activity   . Alcohol use: Not Currently     Alcohol/week: 8.0 standard drinks     Types: 2 Glasses of wine, 3 Cans of beer, 3 Shots of liquor per week   . Drug use: Never   . Sexual activity: Not Currently     Partners: Female     Birth control/protection: Condom   Other Topics Concern   . Not on file   Social History Narrative   . Not on file     Social Determinants of Health     Financial Resource Strain:    . Difficulty of Paying Living Expenses:    Food Insecurity:    . Worried About Programme researcher, broadcasting/film/video in the Last Year:    . Barista in the Last Year:    Transportation Needs:    . Freight forwarder (Medical):    Tanner Lewis Lack of Transportation (Non-Medical):    Physical Activity:    . Days of Exercise per Week:    . Minutes of Exercise per Session:    Stress:    . Feeling of Stress :    Social Connections:    . Frequency of Communication with Friends and Family:    . Frequency of Social Gatherings with Friends and Family:    . Attends Religious Services:    . Active Member of Clubs or Organizations:    . Attends Banker Meetings:    Tanner Lewis Marital Status:    Intimate Partner Violence:    . Fear of Current or Ex-Partner:    . Emotionally Abused:    Tanner Lewis Physically Abused:    . Sexually Abused:        Medications:  Current Outpatient Medications   Medication Sig Dispense Refill   . amlodipine-benazepril (LOTREL) 10-20 MG per capsule Take 1 capsule by mouth daily Take one tablet by mouth once daily.     Tanner Lewis Apoaequorin (Prevagen) 10 MG Cap Take by mouth One capsule once daily.     . hydrALAZINE (APRESOLINE) 50 MG tablet Take 50 mg by mouth 3 (three) times daily Take 1 tablet by mouth three times daily     . isosorbide dinitrate (ISORDIL) 20 MG tablet Take 20 mg by mouth 3 (three) times daily  Take 1 tablet by mouth three times daily.     . Mirabegron ER 50 MG Tablet SR 24 hr Take 1 tablet (50 mg total) by mouth daily 30 tablet 3   . Misc Natural Products (PROSTATE SUPPORT PO) Take by mouth Twice daily.     Tanner Lewis oxybutynin (DITROPAN) 5 MG tablet Take 5 mg by mouth 3 (three) times daily Take one tablet by mouth once dauly     . simvastatin (ZOCOR) 20 MG tablet Take 20 mg by mouth nightly Take one tablet by mouth at bedtime     . tamsulosin (FLOMAX) 0.4 MG Cap Take 0.4 mg by mouth Daily after dinner Take 1 capsule by mouth daily.     Tanner Lewis venlafaxine (EFFEXOR) 75 MG tablet Take 75 mg by mouth 2 (two) times daily Take 1 tablet by mouth at bedtime.       No current facility-administered medications for this visit.         Physical Exam:  Vitals:  08/16/19 1314   BP: 130/75   Pulse: 85   Resp: 16   Temp: 97.4 F (36.3 C)   SpO2: 97%     Body surface area is 1.85 meters squared.  ECOG Performance Status: 0  Psychosocial: Appropriate affect. No psychosocial concerns and good family support.     General Appearance:  Alert, cooperative, in no distress, appears stated age  Head:  Normocephalic without obvious abnormalities, atraumatic  Eyes: Intact EOM. No scleral icterus, conjunctiva/corneas clear  Nose: Deferred, wearing mask  Throat:  Deferred, wearing mask  Neck: Supple, symmetrical, trachea midline  Lungs: Clear to auscultation bilaterally, respirations are unlabored, excursion symmetrical  Heart: Regular rate and rhythm, S1 and S2 normal, no murmurs, no tachycardia  Abdomen: Soft, non-tender, non-distended, bowel sounds normoactive, no palpable masses or organomegaly  Genitourinary: No costovertebral tenderness  Extremities: No cyanosis or edema  Skin: Normal color and turgor, no rashes  Lymph nodes: Deferred  Musculoskeletal: No spinal or paraspinal tenderness  Neurologic: Cranial nerves grossly intact, no focal motor deficits, normal speech and mentation, normal gait       Labs:     Recent Results (from the  past 2688 hour(s))   PSA    Collection Time: 05/11/19  1:40 PM   Result Value Ref Range    Prostate Specific Antigen, Total 3.217 0.000 - 4.000 ng/mL   Vitamin B12    Collection Time: 05/11/19  1:40 PM   Result Value Ref Range    Vitamin B-12 580 211 - 911 pg/mL   Testosterone Free and Total    Collection Time: 05/11/19  1:40 PM   Result Value Ref Range    Total Testosterone 129 (L) 240 - 950 ng/dL    Free Testosterone 1.61 (L) 3.08 - 11.3 ng/dL   Hemoglobin W9U    Collection Time: 05/11/19  1:40 PM   Result Value Ref Range    Hemoglobin A1C 5.8 4.6 - 5.9 %    Average Estimated Glucose 119.8 mg/dL   Hemolysis index    Collection Time: 05/11/19  1:40 PM   Result Value Ref Range    Hemolysis Index 14 0 - 18   Comprehensive metabolic panel    Collection Time: 05/11/19  1:40 PM   Result Value Ref Range    Glucose 100 70 - 100 mg/dL    BUN 04.5 9.0 - 40.9 mg/dL    Creatinine 1.7 (H) 0.5 - 1.5 mg/dL    Sodium 811 914 - 782 mEq/L    Potassium 3.8 3.5 - 5.1 mEq/L    Chloride 102 100 - 111 mEq/L    CO2 24 21 - 29 mEq/L    Calcium 9.3 7.9 - 10.2 mg/dL    Protein, Total 7.6 6.0 - 8.3 g/dL    Albumin 4.1 3.5 - 5.0 g/dL    AST (SGOT) 24 5 - 34 U/L    ALT 22 0 - 55 U/L    Alkaline Phosphatase 50 38 - 106 U/L    Bilirubin, Total 0.9 0.2 - 1.2 mg/dL    Globulin 3.5 2.0 - 3.7 g/dL    Albumin/Globulin Ratio 1.2 0.9 - 2.2    Anion Gap 11.0 5.0 - 15.0   TSH    Collection Time: 05/11/19  1:40 PM   Result Value Ref Range    TSH 0.23 (L) 0.35 - 4.94 uIU/mL   Vitamin D,25 OH, Total    Collection Time: 05/11/19  1:40 PM   Result Value Ref Range  Vitamin D, 25 OH, Total 70 30 - 100 ng/mL   CBC and differential    Collection Time: 05/11/19  1:40 PM   Result Value Ref Range    WBC 4.56 3.10 - 9.50 x10 3/uL    Hgb 13.3 12.5 - 17.1 g/dL    Hematocrit 16.1 09.6 - 49.6 %    Platelets 228 142 - 346 x10 3/uL    RBC 4.41 4.20 - 5.90 x10 6/uL    MCV 93.4 78.0 - 96.0 fL    MCH 30.2 25.1 - 33.5 pg    MCHC 32.3 31.5 - 35.8 g/dL    RDW 13 11 - 15 %     MPV 9.2 8.9 - 12.5 fL    Neutrophils 47.8 None %    Lymphocytes Automated 39.5 None %    Monocytes 10.5 None %    Eosinophils Automated 1.1 None %    Basophils Automated 0.7 None %    Immature Granulocytes 0.4 None %    Nucleated RBC 0.0 0.0 - 0.0 /100 WBC    Neutrophils Absolute 2.18 1.10 - 6.33 x10 3/uL    Lymphocytes Absolute Automated 1.80 0.42 - 3.22 x10 3/uL    Monocytes Absolute Automated 0.48 0.21 - 0.85 x10 3/uL    Eosinophils Absolute Automated 0.05 0.00 - 0.44 x10 3/uL    Basophils Absolute Automated 0.03 0.00 - 0.08 x10 3/uL    Immature Granulocytes Absolute 0.02 0.00 - 0.07 x10 3/uL    Absolute NRBC 0.00 0.00 - 0.00 x10 3/uL   GFR    Collection Time: 05/11/19  1:40 PM   Result Value Ref Range    EGFR 39.1    TSH, Abn Reflex to Free T4, Serum    Collection Time: 08/06/19  8:22 AM   Result Value Ref Range    TSH, Abn Reflex to Free T4, Serum 0.30 (L) 0.35 - 4.94 uIU/mL   Urinalysis POC    Collection Time: 08/09/19  2:39 PM   Result Value Ref Range    POCT Urine Color Yellow Yellow    POCT Urine Clarity Clear Clear - Hazy    POCT Urine pH 7.0 5.0 - 8.0    Urine leukocyte Esterase, POCT Negative Negative    POCT Urine Nitrites Negative Negative    Protein, UR POCT 100 (A) Negative mg/dL    POCT Urine Glucose Negative Negative mg/dL    POCT Urine Ketones Negative Negative mg/dL    POCT Urine Urobilibogen 0.2 0.0 - 1.0 mg/dL    POCT Urine Bilirubin Negative Negative    Blood, UA POCT Negative Negative    Urine Specific Gravity POC 1.020 1.001 - 1.035   .    Rads:   US Renal Kidney Bladder Complete    Result Date: 08/08/2019   1. Bilateral renal cysts. The right upper pole cyst is mildly complex. 2. Otherwise normal examination. Electronically signed by: Al Decant M.D.  [Interpreted at: 45PIC] [facility] MV: 08/08/19       Assessment:     1. Neoplasm of prostate, distant metastasis staging category M1a: metastasis to nonregional lymph nodes    2. Androgen deprivation therapy    3. Lower  urinary tract symptoms (LUTS)         Recommendations:     1. Adenocarcinoma of the prostate, stage IV (TX N1 M1a). Original diagnosis in 2006. Initial PSA was 6.96 ng/ml and prostate biopsy on 02/18/05 confirmed a prostatic adenocarcinoma, Gleason 3+3=6, associated high-grade PIN. S/P 125-I  seed implant (brachytherapy) on 04/25/2005. Reportedly developed biochemical recurrence in 2011 and was treated with ADT from Dec 2011 to March 2013. PSA trends during this time unknown (no records available). PSA noted to be 2.22 ng/ml on 09/09/2014. Bone scan on 10/08/14 was negative for e/o bone metastases. CT abd/pel on 10/08/14 showed "stable bilateral adrenal nodules and multiple prominent retroperitoneal adenopathy with the largest measuring 1.6 x 1.1 cm in the right iliac chain. There was also a new enlarged lymph node along the right pelvic sidewall measuring 1.4 x 1 cm. The retroperitoneal lymph nodes appear to be stable in appearance. The max dimension of the largest lymph node is 1.6 x 0.8 cm." MRI prostate on 11/13/14 demonstrated a "local recurrence in the left lobe of the prostate measuring 2.6 x 1.5 x 1.6 cm with deformation of the prostate capsule and likely extracapsular spread. There was a 1.5 x 1.2 cm right iliac chain lymph node, a 1.3 x 1.3 cm common iliac chain lymph node, additional smaller right iliac chain lymph nodes and right common iliac chain lymph nodes." ADT with Lupron q6 mo resumed on 11/25/2014 after which PSA decreased from 33.67 ng/ml on 11/25/14 down to a nadir of 1.34 by 05/30/15 (per progress notes). Patient did not follow-up for 2nd Lupron dose due in Dec 2016. No records or PSA trends available to review between Dec 2016 until when Lupron was resumed on 12/21/16 and stopped again sometime in 2019. PSA 1.62 on 06/08/17; 1.4 ng/ml on 06/20/17. On 05/11/2019, PSA was found to be 3.217 ng/ml.   Currently, Mr. Macneal remains off ADT. His last dose of Lupron was in 2019 and thus far, there is no evidence  of castrate-resistance.   I recommended repeat labs, including PSA and testosterone level today.   I also recommended restaging CT and bone scans asap to assess his disease burden.    He will return after scans have been completed to discuss further mgmt. I did advise that I would prefer to have tissue confirmation with a biopsy if his scans demonstrate metastatic disease amenable to a biopsy.    Available records are not very complete, so his daughter will bring in more records next time.   On the basis of his stage IV prostate cancer, he does qualify for genetic testing. Will bring up referral to genetic counselor at a later date. No known family history of cancer.     2. Androgen deprivation therapy. He has been on ADT intermittently since 2011. To his knowledge, he has not had a DEXA scan to assess for loss of BMD. I offered a DEXA scan as well, which he is agreeable to. I also recommended Ca and Vit D supplementation for bone health.     3. LUTS. Likely 2/2 overactive bladder. Seen by Dr. Andrey Campanile recently. Started on mirabegron, oxybutynin, and tamsulosin. S/P cystoscopy on 08/09/2019 which was unremarkable. Prostate gland not significantly enlarged.    4. Miscellaneous. He has lived in [place] but moved to the area in Sept 2020 and plans on spending half the year here and the other half in [place] where he has another daughter. He and his daughter here [name]) state that they intend on all keeping all medical appts and treatments here. They are agreeable to having him travel back and forth for any tests and appts that are needed.     5. Follow-up. We agreed for Mr. Obryan to return after his scans have been completed after which we will discuss further management  of his prostate cancer.  I encouraged the patient to call or message me via MyChart with any questions or concerns prior to their next appointment here. They had many relevant questions which I answered to the best of my ability and to their  apparent satisfaction. They voiced understanding and agreed to the plan outlined above.     LOS Documentation:    Number and Complexity of Problems Addressed:  1 or more chronic illness with exacerbation, progression or side effects of treatment    Amount and/or Complexity of Data Reviewed:  - Reviewed prior external notes: urology, PCP, oncology  - Reviewed the following results from 2021, 2020, 2019, 2016 and 2006: 2 unique labs and 3+ unique imaging tests  - Ordered the following unique test(s): 3+ unique labs and 3+ unique imaging tests  - Performed an assessment requiring an independent historian - daughter    Risk of Complications and/or Morbidity or Mortality of Patient Management:  Low risk of morbidity from additional diagnostic testing or treatment     I spent a total of 60 minutes reviewing the patient's chart, including relevant radiological and laboratory findings, pathology reports, and available outside records, counseling, medical decision making, coordination of care, and documentation for today's encounter.    Disclaimer: This note was dictated with voice recognition software. Similar sounding words can inadvertently be transcribed and may not be corrected upon review.    Signed by:    Serena Croissant, MD  Medical Oncologist  [facility] [place]  [address] [facility] 5th [address]  [place], [place] [address]  Tel: [phone]  Fax: [phone]     Ronald Lobo  3/18/20211:46 PM

## 2019-08-17 ENCOUNTER — Other Ambulatory Visit (FREE_STANDING_LABORATORY_FACILITY): Payer: Medicare Other

## 2019-08-17 DIAGNOSIS — R7989 Other specified abnormal findings of blood chemistry: Secondary | ICD-10-CM

## 2019-08-17 DIAGNOSIS — R946 Abnormal results of thyroid function studies: Secondary | ICD-10-CM

## 2019-08-17 DIAGNOSIS — C61 Malignant neoplasm of prostate: Secondary | ICD-10-CM

## 2019-08-17 LAB — CBC AND DIFFERENTIAL
Absolute NRBC: 0 10*3/uL (ref 0.00–0.00)
Basophils Absolute Automated: 0.02 10*3/uL (ref 0.00–0.08)
Basophils Automated: 0.4 %
Eosinophils Absolute Automated: 0.11 10*3/uL (ref 0.00–0.44)
Eosinophils Automated: 2.1 %
Hematocrit: 40.4 % (ref 37.6–49.6)
Hgb: 13.2 g/dL (ref 12.5–17.1)
Immature Granulocytes Absolute: 0.01 10*3/uL (ref 0.00–0.07)
Immature Granulocytes: 0.2 %
Lymphocytes Absolute Automated: 2.23 10*3/uL (ref 0.42–3.22)
Lymphocytes Automated: 41.8 %
MCH: 30.9 pg (ref 25.1–33.5)
MCHC: 32.7 g/dL (ref 31.5–35.8)
MCV: 94.6 fL (ref 78.0–96.0)
MPV: 9.6 fL (ref 8.9–12.5)
Monocytes Absolute Automated: 0.61 10*3/uL (ref 0.21–0.85)
Monocytes: 11.4 %
Neutrophils Absolute: 2.35 10*3/uL (ref 1.10–6.33)
Neutrophils: 44.1 %
Nucleated RBC: 0 /100 WBC (ref 0.0–0.0)
Platelets: 237 10*3/uL (ref 142–346)
RBC: 4.27 10*6/uL (ref 4.20–5.90)
RDW: 14 % (ref 11–15)
WBC: 5.33 10*3/uL (ref 3.10–9.50)

## 2019-08-17 LAB — COMPREHENSIVE METABOLIC PANEL
ALT: 28 U/L (ref 0–55)
AST (SGOT): 28 U/L (ref 5–34)
Albumin/Globulin Ratio: 1.1 (ref 0.9–2.2)
Albumin: 3.9 g/dL (ref 3.5–5.0)
Alkaline Phosphatase: 59 U/L (ref 38–106)
Anion Gap: 9 (ref 5.0–15.0)
BUN: 22 mg/dL (ref 9.0–28.0)
Bilirubin, Total: 0.5 mg/dL (ref 0.2–1.2)
CO2: 27 mEq/L (ref 21–29)
Calcium: 9.1 mg/dL (ref 7.9–10.2)
Chloride: 102 mEq/L (ref 100–111)
Creatinine: 1.7 mg/dL — ABNORMAL HIGH (ref 0.5–1.5)
Globulin: 3.5 g/dL (ref 2.0–3.7)
Glucose: 121 mg/dL — ABNORMAL HIGH (ref 70–100)
Potassium: 3.7 mEq/L (ref 3.5–5.1)
Protein, Total: 7.4 g/dL (ref 6.0–8.3)
Sodium: 138 mEq/L (ref 136–145)

## 2019-08-17 LAB — T4, FREE: T4 Free: 1 ng/dL (ref 0.70–1.48)

## 2019-08-17 LAB — TSH: TSH: 0.39 u[IU]/mL (ref 0.35–4.94)

## 2019-08-17 LAB — TESTOSTERONE: Testosterone: 428 ng/dL (ref 241–827)

## 2019-08-17 LAB — T3, FREE: T3, Free: 2.97 pg/mL (ref 1.71–3.71)

## 2019-08-17 LAB — PSA: Prostate Specific Antigen, Total: 10.508 ng/mL — ABNORMAL HIGH (ref 0.000–4.000)

## 2019-08-17 LAB — GFR: EGFR: 39.1

## 2019-08-17 LAB — HEMOLYSIS INDEX: Hemolysis Index: 4 (ref 0–18)

## 2019-08-20 ENCOUNTER — Encounter: Payer: Self-pay | Admitting: Hematology & Oncology

## 2019-08-20 NOTE — Progress Notes (Signed)
Hi Tanner Lewis,     Your thyroid function on repeat was within normal limits.  No need for medication.  We will continue monitor this for you and repeat this next year or if there is a change in your energy level and metabolism.

## 2019-08-28 ENCOUNTER — Telehealth: Payer: Self-pay | Admitting: Hematology & Oncology

## 2019-08-28 NOTE — Telephone Encounter (Signed)
Please call the patient to schedule a follow up appointment with Dr. Cathie Hoops after his scans are complete (4/19). Thanks!

## 2019-08-28 NOTE — Telephone Encounter (Signed)
Spoke with pts daughter, will call back to to reschedule scans, pt is in [place].

## 2019-09-14 ENCOUNTER — Telehealth (INDEPENDENT_AMBULATORY_CARE_PROVIDER_SITE_OTHER): Payer: Self-pay | Admitting: Family Medicine

## 2019-09-14 ENCOUNTER — Encounter (INDEPENDENT_AMBULATORY_CARE_PROVIDER_SITE_OTHER): Payer: Self-pay | Admitting: Family Medicine

## 2019-09-14 NOTE — Progress Notes (Signed)
Last Office Visit:05/22/2019  Last blood work:08/17/2018  Upcoming appointments: None

## 2019-09-14 NOTE — Telephone Encounter (Signed)
Patient is out of his medication and needs this to be refilled today    Name, strength, directions of requested refill(s):    venlafaxine (EFFEXOR) 75 MG tablet    hydrALAZINE (APRESOLINE) 50 MG tablet    Pharmacy to send refill to or patient to pick up rx from office (mark requested pharmacy in BOLD):      [facility] [address], [place] - [address] [facility]  [address]  [place] [address]  Phone: [phone] Fax: [phone]        Please mark "X" next to the preferred call back number:    Mobile:   Telephone Information:   Mobile [phone]        X   Home: @HOMEPHONE @    Work: @WORKPHONE @          Next visit: Visit date not found

## 2019-09-17 ENCOUNTER — Ambulatory Visit: Payer: Medicare Other

## 2019-09-17 MED ORDER — VENLAFAXINE HCL 75 MG PO TABS
75.0000 mg | ORAL_TABLET | Freq: Two times a day (BID) | ORAL | 0 refills | Status: DC
Start: 2019-09-17 — End: 2019-09-18

## 2019-09-17 MED ORDER — HYDRALAZINE HCL 50 MG PO TABS
50.0000 mg | ORAL_TABLET | Freq: Three times a day (TID) | ORAL | 0 refills | Status: DC
Start: 2019-09-17 — End: 2019-09-18

## 2019-09-17 NOTE — Telephone Encounter (Signed)
Refills request routed to PCP. Dr. Heide Spark.

## 2019-09-18 ENCOUNTER — Telehealth (INDEPENDENT_AMBULATORY_CARE_PROVIDER_SITE_OTHER): Payer: Self-pay

## 2019-09-18 ENCOUNTER — Other Ambulatory Visit (INDEPENDENT_AMBULATORY_CARE_PROVIDER_SITE_OTHER): Payer: Self-pay | Admitting: Family Medicine

## 2019-09-18 MED ORDER — HYDRALAZINE HCL 50 MG PO TABS
50.0000 mg | ORAL_TABLET | Freq: Three times a day (TID) | ORAL | 1 refills | Status: DC
Start: 2019-09-18 — End: 2019-10-01

## 2019-09-18 MED ORDER — VENLAFAXINE HCL 75 MG PO TABS
75.0000 mg | ORAL_TABLET | Freq: Two times a day (BID) | ORAL | 1 refills | Status: DC
Start: 2019-09-18 — End: 2019-10-01

## 2019-09-18 NOTE — Telephone Encounter (Signed)
Ms. Verlon Au placed a call from [facility] to request a clarification about the instructions of hydrALAZINE (APRESOLINE) 50 MG tablet and venlafaxine (EFFEXOR) 75 MG tablet. PCP Dr. Heide Spark will reach out to them at [phone].

## 2019-09-28 ENCOUNTER — Other Ambulatory Visit (INDEPENDENT_AMBULATORY_CARE_PROVIDER_SITE_OTHER): Payer: Self-pay | Admitting: Family Medicine

## 2019-10-01 ENCOUNTER — Telehealth (INDEPENDENT_AMBULATORY_CARE_PROVIDER_SITE_OTHER): Payer: Self-pay | Admitting: Family Medicine

## 2019-10-01 ENCOUNTER — Other Ambulatory Visit (INDEPENDENT_AMBULATORY_CARE_PROVIDER_SITE_OTHER): Payer: Self-pay | Admitting: Family Medicine

## 2019-10-01 NOTE — Telephone Encounter (Addendum)
error 

## 2019-10-01 NOTE — Telephone Encounter (Signed)
Patients daughter [name]) called the office stating that patient is in [place] visiting another daughter and has been without medication for 3 weeks. Patients daughter stated initially she had patients medication sent via [employer], however, [employer] lost the package. Patient would like new script of 6 medications sent to [facility] in [place],  listed in previous note.     PCP unavailable at this time. Message sent to PCP with [facility]!

## 2019-10-01 NOTE — Telephone Encounter (Signed)
Name, strength, directions of requested refill(s):    Myrbetriq 50 mg  venlafaxine (EFFEXOR) 75 MG tablet  amlodipine-benazepril (LOTREL) 10-20 MG per capsule  hydrALAZINE (APRESOLINE) 50 MG tablet  tamsulosin (FLOMAX) 0.4 MG Cap  oxybutynin (DITROPAN) 5 MG tablet    Daughter stated the patient has been out for weeks now.  Stated she mailed the patient's medications through [employer]; however, patient has not received them yet.  Please advise.     [phone] - [name] - daughter) - please call daughter when medications have been sent.      Pharmacy to send refill to or patient to pick up rx from office (mark requested pharmacy in BOLD):      [facility] [address], [place] - [address]  [address]  [place] [address]  Phone: [phone] Fax: [phone]      [id number] [place], [place] - [address]  Phone:  [phone]   Fax:  [phone]       Please mark "X" next to the preferred call back number:    Mobile:   Telephone Information:   Mobile [phone]    x   Home: @HOMEPHONE @    Work: @WORKPHONE @          Next visit: 10/23/2019

## 2019-10-02 ENCOUNTER — Encounter (INDEPENDENT_AMBULATORY_CARE_PROVIDER_SITE_OTHER): Payer: Self-pay | Admitting: Family Medicine

## 2019-10-02 MED ORDER — AMLODIPINE BESY-BENAZEPRIL HCL 10-20 MG PO CAPS
1.00 | ORAL_CAPSULE | Freq: Every day | ORAL | 2 refills | Status: DC
Start: 2019-10-02 — End: 2019-10-23

## 2019-10-02 MED ORDER — TAMSULOSIN HCL 0.4 MG PO CAPS
0.40 mg | ORAL_CAPSULE | Freq: Every day | ORAL | 1 refills | Status: DC
Start: 2019-10-02 — End: 2019-10-23

## 2019-10-02 MED ORDER — MIRABEGRON ER 50 MG PO TB24
50.00 mg | ORAL_TABLET | Freq: Every day | ORAL | 2 refills | Status: DC
Start: 2019-10-02 — End: 2019-10-23

## 2019-10-02 MED ORDER — VENLAFAXINE HCL 75 MG PO TABS
75.0000 mg | ORAL_TABLET | Freq: Two times a day (BID) | ORAL | 1 refills | Status: DC
Start: 2019-10-02 — End: 2019-10-03

## 2019-10-02 MED ORDER — HYDRALAZINE HCL 50 MG PO TABS
50.0000 mg | ORAL_TABLET | Freq: Three times a day (TID) | ORAL | 2 refills | Status: DC
Start: 2019-10-02 — End: 2019-10-03

## 2019-10-02 MED ORDER — OXYBUTYNIN CHLORIDE 5 MG PO TABS
5.0000 mg | ORAL_TABLET | Freq: Three times a day (TID) | ORAL | 2 refills | Status: DC
Start: 2019-10-02 — End: 2019-10-05

## 2019-10-02 NOTE — Telephone Encounter (Signed)
Pt's daughter called back in regards to the previous message and would like a call back ASAP at [phone] once this is done. Thank you!

## 2019-10-02 NOTE — Telephone Encounter (Signed)
[facility] pharmacy would like clarification on "directions" for the patient's medications below.  See encounter dated 10/01/19.  Please call and advise.    venlafaxine (EFFEXOR) 75 MG tablet  oxybutynin (DITROPAN) 5 MG tablet    [facility] [address] [place], [place] - [address]  Phone:  [phone]   Fax:  [phone]

## 2019-10-02 NOTE — Telephone Encounter (Signed)
Pts daughter called again - her father needs the following meds ASAP he is feeling lightheaded.  The medications were lost by [employer] and he has been off of them for 2 weeks:    venlafaxine (EFFEXOR) 75 MG tablet  hydrALAZINE (APRESOLINE) 50 MG tablet  amlodipine-benazepril (LOTREL) 10-20 MG per capsule  oxybutynin (DITROPAN) 5 MG tablet  tamsulosin (FLOMAX) 0.4 MG Cap    Would like the meds sent to the [facility] in below message :     [place]   Ph. [phone]    Please advise     Pts daughter [name])  [phone]

## 2019-10-03 ENCOUNTER — Other Ambulatory Visit (INDEPENDENT_AMBULATORY_CARE_PROVIDER_SITE_OTHER): Payer: Self-pay

## 2019-10-03 ENCOUNTER — Telehealth (INDEPENDENT_AMBULATORY_CARE_PROVIDER_SITE_OTHER): Payer: Self-pay | Admitting: Family Medicine

## 2019-10-03 NOTE — Telephone Encounter (Signed)
Pharmacy needs clarification on the below RX - is it 2x daily or 3x daily?     Please advise           venlafaxine (EFFEXOR) 75 MG tablet 90 tablet 1 10/02/2019     Sig - Route: Take 1 tablet (75 mg total) by mouth 2 (two) times daily Take 1 tablet by mouth at bedtime. - Oral    Sent to pharmacy as: venlafaxine (EFFEXOR) 75 MG tablet          [facility]  Ph: [phone]

## 2019-10-04 ENCOUNTER — Telehealth (INDEPENDENT_AMBULATORY_CARE_PROVIDER_SITE_OTHER): Payer: Self-pay | Admitting: Family Medicine

## 2019-10-04 NOTE — Telephone Encounter (Signed)
[facility] pharmacy in [place] called requesting clarification on qty and directions for:  oxybutynin (DITROPAN) 5 MG tablet  and venlafaxine (EFFEXOR) 75 MG tablet    They have been tried to fax request but it doesn't go through.  Please advise and contact pharmacy :  [facility] [address] [place] - [address] Phone:  [phone]   Fax:  [phone]

## 2019-10-05 ENCOUNTER — Telehealth (INDEPENDENT_AMBULATORY_CARE_PROVIDER_SITE_OTHER): Payer: Self-pay | Admitting: Family Medicine

## 2019-10-05 ENCOUNTER — Other Ambulatory Visit (INDEPENDENT_AMBULATORY_CARE_PROVIDER_SITE_OTHER): Payer: Self-pay

## 2019-10-05 MED ORDER — VENLAFAXINE HCL 75 MG PO TABS
75.0000 mg | ORAL_TABLET | Freq: Two times a day (BID) | ORAL | 0 refills | Status: DC
Start: 2019-10-05 — End: 2019-10-05

## 2019-10-05 MED ORDER — OXYBUTYNIN CHLORIDE 5 MG PO TABS
5.0000 mg | ORAL_TABLET | Freq: Three times a day (TID) | ORAL | 0 refills | Status: DC
Start: 2019-10-05 — End: 2019-10-08

## 2019-10-05 MED ORDER — HYDRALAZINE HCL 50 MG PO TABS
50.0000 mg | ORAL_TABLET | Freq: Three times a day (TID) | ORAL | 0 refills | Status: DC
Start: 2019-10-05 — End: 2019-10-23

## 2019-10-05 MED ORDER — VENLAFAXINE HCL 75 MG PO TABS
75.0000 mg | ORAL_TABLET | Freq: Two times a day (BID) | ORAL | 0 refills | Status: DC
Start: 2019-10-05 — End: 2019-10-08

## 2019-10-05 NOTE — Telephone Encounter (Signed)
Pharmacy called again for clarification on these two medications - patient was waiting at the pharmacy and has been out of his meds for some time now.    Is there another doctor that can help while Dr. Heide Spark is OOO?     Please advise

## 2019-10-05 NOTE — Telephone Encounter (Signed)
Please advise 

## 2019-10-05 NOTE — Telephone Encounter (Signed)
Please advise Dr. O.

## 2019-10-05 NOTE — Telephone Encounter (Signed)
Name, strength, directions of requested refill(s):    NEED CORRECTED SIG FOR   OXYBUTININ....please verify  EFFEXOR...[name] please verify   SIG directions are conflicting  Patient is out of medication       Pharmacy to send refill to or patient to pick up rx from office (mark requested pharmacy in BOLD):      [facility] [address] [place] - [address] [facility]  [address]  [place] [address]  Phone: [phone] Fax: [phone]    [facility] [address] [place] - [address]  [address] [address]  Phone: [phone] Fax: [phone]        Please mark "X" next to the preferred call back number:    Mobile:   Telephone Information:   Mobile [phone]       Home: @HOMEPHONE @    Work: @WORKPHONE @          Next visit: 10/23/2019

## 2019-10-08 ENCOUNTER — Other Ambulatory Visit (INDEPENDENT_AMBULATORY_CARE_PROVIDER_SITE_OTHER): Payer: Self-pay | Admitting: Family Medicine

## 2019-10-08 MED ORDER — OXYBUTYNIN CHLORIDE 5 MG PO TABS
5.0000 mg | ORAL_TABLET | Freq: Three times a day (TID) | ORAL | 0 refills | Status: DC
Start: 2019-10-08 — End: 2019-10-23

## 2019-10-08 MED ORDER — VENLAFAXINE HCL 75 MG PO TABS
75.0000 mg | ORAL_TABLET | Freq: Two times a day (BID) | ORAL | 1 refills | Status: DC
Start: 2019-10-08 — End: 2019-10-23

## 2019-10-08 NOTE — Telephone Encounter (Signed)
Sp with pharm. Pt has p/u meds on 10/05/2019.

## 2019-10-08 NOTE — Telephone Encounter (Signed)
Orders updated. Please reach out to pharmacy to confirm

## 2019-10-08 NOTE — Telephone Encounter (Signed)
BID. I have resent the medication

## 2019-10-15 ENCOUNTER — Telehealth (INDEPENDENT_AMBULATORY_CARE_PROVIDER_SITE_OTHER): Payer: Medicare Other | Admitting: Urology

## 2019-10-22 ENCOUNTER — Ambulatory Visit
Admission: RE | Admit: 2019-10-22 | Discharge: 2019-10-22 | Disposition: A | Discharge status: Home / Self Care | Payer: Medicare Other | Source: Ambulatory Visit | Attending: Hematology & Oncology | Admitting: Hematology & Oncology

## 2019-10-22 DIAGNOSIS — Z79818 Long term (current) use of other agents affecting estrogen receptors and estrogen levels: Secondary | ICD-10-CM

## 2019-10-22 DIAGNOSIS — R590 Localized enlarged lymph nodes: Secondary | ICD-10-CM | POA: Insufficient documentation

## 2019-10-22 DIAGNOSIS — N3289 Other specified disorders of bladder: Secondary | ICD-10-CM | POA: Insufficient documentation

## 2019-10-22 DIAGNOSIS — M85852 Other specified disorders of bone density and structure, left thigh: Secondary | ICD-10-CM | POA: Insufficient documentation

## 2019-10-22 DIAGNOSIS — C61 Malignant neoplasm of prostate: Secondary | ICD-10-CM

## 2019-10-22 DIAGNOSIS — K7689 Other specified diseases of liver: Secondary | ICD-10-CM | POA: Insufficient documentation

## 2019-10-22 DIAGNOSIS — C779 Secondary and unspecified malignant neoplasm of lymph node, unspecified: Secondary | ICD-10-CM | POA: Insufficient documentation

## 2019-10-22 DIAGNOSIS — E278 Other specified disorders of adrenal gland: Secondary | ICD-10-CM | POA: Insufficient documentation

## 2019-10-22 DIAGNOSIS — N281 Cyst of kidney, acquired: Secondary | ICD-10-CM | POA: Insufficient documentation

## 2019-10-22 LAB — WHOLE BLOOD CREATININE WITH GFR POCT
GFR POCT: >60 mL/min/{1.73_m2} (ref >60)
Whole Blood Creatinine POCT: 1 mg/dL (ref 0.5–1.1)

## 2019-10-22 MED ORDER — IOHEXOL 350 MG/ML IV SOLN
100.00 mL | Freq: Once | INTRAVENOUS | Status: AC | PRN
Start: 2019-10-22 — End: 2019-10-22
  Administered 2019-10-22: 09:00:00 100 mL via INTRAVENOUS

## 2019-10-23 ENCOUNTER — Ambulatory Visit (INDEPENDENT_AMBULATORY_CARE_PROVIDER_SITE_OTHER): Payer: Medicare Other | Admitting: Family Medicine

## 2019-10-23 ENCOUNTER — Encounter (INDEPENDENT_AMBULATORY_CARE_PROVIDER_SITE_OTHER): Payer: Self-pay | Admitting: Family Medicine

## 2019-10-23 ENCOUNTER — Other Ambulatory Visit: Payer: Self-pay

## 2019-10-23 ENCOUNTER — Telehealth: Payer: Self-pay

## 2019-10-23 VITALS — BP 112/68 | HR 83 | Temp 97.4°F | Ht 67.0 in | Wt 170.6 lb

## 2019-10-23 DIAGNOSIS — F32A Depression, unspecified: Secondary | ICD-10-CM

## 2019-10-23 DIAGNOSIS — R6889 Other general symptoms and signs: Secondary | ICD-10-CM

## 2019-10-23 DIAGNOSIS — R4020 Unspecified coma: Secondary | ICD-10-CM

## 2019-10-23 DIAGNOSIS — I10 Essential (primary) hypertension: Secondary | ICD-10-CM

## 2019-10-23 DIAGNOSIS — R3912 Poor urinary stream: Secondary | ICD-10-CM

## 2019-10-23 DIAGNOSIS — F329 Major depressive disorder, single episode, unspecified: Secondary | ICD-10-CM

## 2019-10-23 DIAGNOSIS — E782 Mixed hyperlipidemia: Secondary | ICD-10-CM

## 2019-10-23 DIAGNOSIS — Z8679 Personal history of other diseases of the circulatory system: Secondary | ICD-10-CM

## 2019-10-23 DIAGNOSIS — Z79818 Long term (current) use of other agents affecting estrogen receptors and estrogen levels: Secondary | ICD-10-CM

## 2019-10-23 DIAGNOSIS — C61 Malignant neoplasm of prostate: Secondary | ICD-10-CM

## 2019-10-23 DIAGNOSIS — R5383 Other fatigue: Secondary | ICD-10-CM

## 2019-10-23 MED ORDER — HYDRALAZINE HCL 50 MG PO TABS
50.0000 mg | ORAL_TABLET | Freq: Three times a day (TID) | ORAL | 1 refills | Status: DC
Start: 2019-10-23 — End: 2020-04-21

## 2019-10-23 MED ORDER — SIMVASTATIN 20 MG PO TABS
20.0000 mg | ORAL_TABLET | Freq: Every evening | ORAL | 1 refills | Status: DC
Start: 2019-10-23 — End: 2020-04-21

## 2019-10-23 MED ORDER — MIRABEGRON ER 50 MG PO TB24
50.00 mg | ORAL_TABLET | Freq: Every day | ORAL | 2 refills | Status: DC
Start: 2019-10-23 — End: 2020-04-04

## 2019-10-23 MED ORDER — VENLAFAXINE HCL 75 MG PO TABS
75.0000 mg | ORAL_TABLET | Freq: Every day | ORAL | 1 refills | Status: DC
Start: 2019-10-23 — End: 2020-04-21

## 2019-10-23 MED ORDER — TAMSULOSIN HCL 0.4 MG PO CAPS
0.40 mg | ORAL_CAPSULE | Freq: Every day | ORAL | 1 refills | Status: DC
Start: 2019-10-23 — End: 2020-07-10

## 2019-10-23 MED ORDER — ISOSORBIDE DINITRATE 20 MG PO TABS
20.0000 mg | ORAL_TABLET | Freq: Two times a day (BID) | ORAL | 1 refills | Status: DC
Start: 2019-10-23 — End: 2019-11-01

## 2019-10-23 MED ORDER — AMLODIPINE BESY-BENAZEPRIL HCL 10-20 MG PO CAPS
1.00 | ORAL_CAPSULE | Freq: Every day | ORAL | 1 refills | Status: DC
Start: 2019-10-23 — End: 2020-02-11

## 2019-10-23 MED ORDER — OXYBUTYNIN CHLORIDE 5 MG PO TABS
5.0000 mg | ORAL_TABLET | Freq: Three times a day (TID) | ORAL | 1 refills | Status: DC
Start: 2019-10-23 — End: 2019-10-25

## 2019-10-23 NOTE — Telephone Encounter (Signed)
Patient scheduled for Scans 11/13/2019@11 :00 am 3rd floor [facility].

## 2019-10-23 NOTE — Telephone Encounter (Signed)
Spoke with patient's daughter, [name]. Informed her of patient's MRI abdomen scheduled for 6/15 at 11 am. Notified her of scheduling conflict between MRI appointment and video visit with his urologist, Dr. Andrey Campanile. She will call Dr. Tawana Scale office to reschedule video visit. She agreed and verbalized understanding.

## 2019-10-23 NOTE — Telephone Encounter (Signed)
Reviewed with Dr. Cathie Hoops. Please assist patient with scheduling a MRI abdomen with and without contrast. Order in Epic.

## 2019-10-23 NOTE — Telephone Encounter (Signed)
Reviewed with Dr. Cathie Hoops. Spoke with patient's daughter, [name], regarding his CT abdomen and pelvis. Informed her that Dr. Cathie Hoops reviewed his scan results, and it showed a few slightly enlarged lymph nodes. None of those lymph nodes were easy to get to for a biopsy. Dr. Cathie Hoops recommends for him to have an MRI abdomen with and without contrast to get a better look at the spot in his liver and adrenal glands that might be easier to biopsy if they appear to be related to his cancer on MRI. Patient's daughter agreeable to get the MRI. Will notify Dr. Dolores Hoose agreed and verbalized understanding.

## 2019-10-23 NOTE — Progress Notes (Signed)
Have you seen any specialists/other providers since your last visit with [place]?    Yes    Do you agree to telemedicine visit?  No    Arm preference verified?   Yes, no preference    Health Maintenance Due   Topic Date Due   . Advance Directive on File  Never done   . [facility] CARE PLAN LETTER  Never done   . Medicare Annual Wellness Visit  Never done   . Spirometry every Two Years  Never done   . Shingrix Vaccine 50+ (1) Never done   . DEPRESSION SCREENING  Never done

## 2019-10-23 NOTE — Progress Notes (Signed)
Subjective:      Date: 10/23/2019 9:31 AM   Patient ID: Tanner Lewis is a 79 y.o. male.    Chief Complaint:  Chief Complaint   Patient presents with   . Hypertension     follow up DMV form need to fill out    . Depression     follow up        HPI:  HPI   Follow up of hypertension and medication refill.     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 He is following with Dr. Cathie Hoops ( urologist) and Dr. Andrey Campanile (hemalogist)  Prostate cancer with spreading      Problem List:  Patient Active Problem List   Diagnosis   . Essential hypertension   . History of prostate cancer   . Fatigue, unspecified type   . Keeps losing balance   . Age-related cataract of both eyes, unspecified age-related cataract type   . 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 Nocturia   . Weak urinary stream       Current Medications:  Current Outpatient Medications   Medication Sig Dispense Refill   . amlodipine-benazepril (LOTREL) 10-20 MG per capsule Take 1 capsule by mouth daily Take one tablet by mouth once daily. 90 capsule 1   . Apoaequorin (Prevagen) 10 MG Cap Take by mouth One capsule once daily.     . hydrALAZINE (APRESOLINE) 50 MG tablet Take 1 tablet (50 mg total) by mouth 3 (three) times daily Take 1 tablet by mouth three times daily 270 tablet 1   . isosorbide dinitrate (ISORDIL) 20 MG tablet Take 1 tablet (20 mg total) by mouth 2 (two) times daily Take 1 tablet by mouth three times daily. 180 tablet 1   . Mirabegron ER 50 MG Tablet SR 24 hr Take 1 tablet (50 mg total) by mouth daily 30 tablet 2   . Misc Natural Products (PROSTATE SUPPORT PO) Take by mouth Twice daily.     . simvastatin (ZOCOR) 20 MG tablet Take 1 tablet (20 mg total) by mouth nightly Take one tablet by mouth at bedtime 90 tablet 1   . tamsulosin (FLOMAX) 0.4 MG Cap Take 1 capsule (0.4 mg total) by mouth Daily after dinner Take 1 capsule by mouth daily. 90 capsule 1   .  venlafaxine (EFFEXOR) 75 MG tablet Take 1 tablet (75 mg total) by mouth daily 90 tablet 1   . oxybutynin (DITROPAN) 5 MG tablet Take 1 tablet (5 mg total) by mouth 3 (three) times daily 270 tablet 1     No current facility-administered medications for this visit.       Allergies:  Allergies   Allergen Reactions   . Solarcaine [Benzocaine] Rash     Happened over 5 years ago.       Past Medical History:  Past Medical History:   Diagnosis Date   . Hypertension    . Prostate cancer 2010       ROS:  Review of Systems       Objective:     Vitals:  BP 112/68   Pulse 83   Temp 97.4 F (36.3 C) (Temporal)   Ht 1.702 m (5\' 7" )   Wt 77.4 kg (170 lb 9.6 oz)   BMI 26.72 kg/m     Physical Exam:  Physical Exam      Assessment/Plan:  1. Essential hypertension  - Chronic medical problem  - Refill meds today   - amlodipine-benazepril (LOTREL) 10-20 MG per capsule; Take 1 capsule by mouth daily Take one tablet by mouth once daily.  Dispense: 90 capsule; Refill: 1  - hydrALAZINE (APRESOLINE) 50 MG tablet; Take 1 tablet (50 mg total) by mouth 3 (three) times daily Take 1 tablet by mouth three times daily  Dispense: 270 tablet; Refill: 1  - isosorbide dinitrate (ISORDIL) 20 MG tablet; Take 1 tablet (20 mg total) by mouth 2 (two) times daily Take 1 tablet by mouth three times daily.  Dispense: 180 tablet; Refill: 1    2. History of aneurysm  3. LOC (loss of consciousness)  4. 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 Investigate for possible brain mets and monitor aneurysm  - MRI Brain W WO Contrast; Future    5. Other general symptoms and signs     6. Weak urinary stream  - Refill medication  - Mirabegron ER 50 MG Tablet SR 24 hr; Take 1 tablet (50 mg total) by mouth daily  Dispense: 30 tablet; Refill: 2  - tamsulosin (FLOMAX) 0.4 MG Cap; Take 1 capsule (0.4 mg total) by mouth Daily after dinner Take 1 capsule by mouth  daily.  Dispense: 90 capsule; Refill: 1    7. Mixed hyperlipidemia  - Stable.  - Continue statin  - simvastatin (ZOCOR) 20 MG tablet; Take 1 tablet (20 mg total) by mouth nightly Take one tablet by mouth at bedtime  Dispense: 90 tablet; Refill: 1    8. Depression, unspecified depression type  - COntine effexor  - Reports stable mood  - venlafaxine (EFFEXOR) 75 MG tablet; Take 1 tablet (75 mg total) by mouth daily  Dispense: 90 tablet; Refill: 1      No follow-ups on file.    Theresia Lo Owusu-Boateng, DO

## 2019-10-25 ENCOUNTER — Other Ambulatory Visit (INDEPENDENT_AMBULATORY_CARE_PROVIDER_SITE_OTHER): Payer: Self-pay | Admitting: Family Medicine

## 2019-10-25 MED ORDER — OXYBUTYNIN CHLORIDE 5 MG PO TABS
5.0000 mg | ORAL_TABLET | Freq: Three times a day (TID) | ORAL | 1 refills | Status: DC
Start: 2019-10-25 — End: 2020-02-11

## 2019-10-29 ENCOUNTER — Encounter (INDEPENDENT_AMBULATORY_CARE_PROVIDER_SITE_OTHER): Payer: Self-pay | Admitting: Family Medicine

## 2019-11-01 ENCOUNTER — Other Ambulatory Visit (INDEPENDENT_AMBULATORY_CARE_PROVIDER_SITE_OTHER): Payer: Self-pay | Admitting: Family Medicine

## 2019-11-01 DIAGNOSIS — I10 Essential (primary) hypertension: Secondary | ICD-10-CM

## 2019-11-01 MED ORDER — ISOSORBIDE DINITRATE 20 MG PO TABS
20.0000 mg | ORAL_TABLET | Freq: Two times a day (BID) | ORAL | 1 refills | Status: DC
Start: 2019-11-01 — End: 2020-07-10

## 2019-11-01 NOTE — Addendum Note (Signed)
Addended by: Iantha Fallen, Lynda Rainwater on: 11/01/2019 03:07 PM     Modules accepted: Orders

## 2019-11-13 ENCOUNTER — Ambulatory Visit
Admission: RE | Admit: 2019-11-13 | Discharge: 2019-11-13 | Disposition: A | Discharge status: Home / Self Care | Payer: Medicare Other | Source: Ambulatory Visit | Attending: Family Medicine | Admitting: Family Medicine

## 2019-11-13 ENCOUNTER — Telehealth (INDEPENDENT_AMBULATORY_CARE_PROVIDER_SITE_OTHER): Payer: Medicare Other | Admitting: Urology

## 2019-11-13 DIAGNOSIS — Z8679 Personal history of other diseases of the circulatory system: Secondary | ICD-10-CM | POA: Insufficient documentation

## 2019-11-13 DIAGNOSIS — Z8673 Personal history of transient ischemic attack (TIA), and cerebral infarction without residual deficits: Secondary | ICD-10-CM | POA: Insufficient documentation

## 2019-11-13 DIAGNOSIS — Z79818 Long term (current) use of other agents affecting estrogen receptors and estrogen levels: Secondary | ICD-10-CM | POA: Insufficient documentation

## 2019-11-13 DIAGNOSIS — R5383 Other fatigue: Secondary | ICD-10-CM

## 2019-11-13 DIAGNOSIS — R4020 Unspecified coma: Secondary | ICD-10-CM

## 2019-11-13 DIAGNOSIS — N281 Cyst of kidney, acquired: Secondary | ICD-10-CM | POA: Insufficient documentation

## 2019-11-13 DIAGNOSIS — R590 Localized enlarged lymph nodes: Secondary | ICD-10-CM | POA: Insufficient documentation

## 2019-11-13 DIAGNOSIS — C61 Malignant neoplasm of prostate: Secondary | ICD-10-CM | POA: Insufficient documentation

## 2019-11-13 MED ORDER — GADOBUTROL 1 MMOL/ML IV SOLN
10.00 mL | Freq: Once | INTRAVENOUS | Status: AC | PRN
Start: 2019-11-13 — End: 2019-11-13
  Administered 2019-11-13: 13:00:00 10 mmol via INTRAVENOUS
  Filled 2019-11-13: qty 10

## 2019-11-14 ENCOUNTER — Other Ambulatory Visit: Payer: Self-pay

## 2019-11-14 ENCOUNTER — Other Ambulatory Visit: Payer: Self-pay | Admitting: Hematology & Oncology

## 2019-11-14 ENCOUNTER — Telehealth: Payer: Self-pay

## 2019-11-14 DIAGNOSIS — C61 Malignant neoplasm of prostate: Secondary | ICD-10-CM

## 2019-11-14 NOTE — Telephone Encounter (Signed)
Called and spoke with pt's daughter and relayed the information to her. Called and placed order for CT Guided retroperitoneal biopsy (IMG 1123). Albin Felling will get back with [place] about scheduling after running it by the doctors.

## 2019-11-15 NOTE — Telephone Encounter (Signed)
Called and let pt's daughter know about the biopsy. I also sent an MyChart message about it as well

## 2019-11-16 ENCOUNTER — Encounter (INDEPENDENT_AMBULATORY_CARE_PROVIDER_SITE_OTHER): Payer: Self-pay | Admitting: Family Medicine

## 2019-11-16 NOTE — Progress Notes (Signed)
Hi Stevon,     MRI of the brain was normal, no mass no swelling no fluid, no bleeding.     Radiologist did note old changes in the brain from a lack of oxygenation, and old scattered small bleeds.  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

## 2019-11-20 ENCOUNTER — Other Ambulatory Visit: Payer: Self-pay

## 2019-11-20 ENCOUNTER — Other Ambulatory Visit (FREE_STANDING_LABORATORY_FACILITY): Payer: Medicare Other

## 2019-11-20 DIAGNOSIS — C61 Malignant neoplasm of prostate: Secondary | ICD-10-CM

## 2019-11-20 LAB — CBC AND DIFFERENTIAL
Absolute NRBC: 0 10*3/uL (ref 0.00–0.00)
Basophils Absolute Automated: 0.02 10*3/uL (ref 0.00–0.08)
Basophils Automated: 0.4 %
Eosinophils Absolute Automated: 0.07 10*3/uL (ref 0.00–0.44)
Eosinophils Automated: 1.4 %
Hematocrit: 38.3 % (ref 37.6–49.6)
Hgb: 12.5 g/dL (ref 12.5–17.1)
Immature Granulocytes Absolute: 0.01 10*3/uL (ref 0.00–0.07)
Immature Granulocytes: 0.2 %
Lymphocytes Absolute Automated: 1.96 10*3/uL (ref 0.42–3.22)
Lymphocytes Automated: 39.8 %
MCH: 30.6 pg (ref 25.1–33.5)
MCHC: 32.6 g/dL (ref 31.5–35.8)
MCV: 93.9 fL (ref 78.0–96.0)
MPV: 9.6 fL (ref 8.9–12.5)
Monocytes Absolute Automated: 0.58 10*3/uL (ref 0.21–0.85)
Monocytes: 11.8 %
Neutrophils Absolute: 2.29 10*3/uL (ref 1.10–6.33)
Neutrophils: 46.4 %
Nucleated RBC: 0 /100 WBC (ref 0.0–0.0)
Platelets: 220 10*3/uL (ref 142–346)
RBC: 4.08 10*6/uL — ABNORMAL LOW (ref 4.20–5.90)
RDW: 14 % (ref 11–15)
WBC: 4.93 10*3/uL (ref 3.10–9.50)

## 2019-11-21 ENCOUNTER — Ambulatory Visit: Payer: Self-pay

## 2019-11-21 ENCOUNTER — Ambulatory Visit
Admission: RE | Admit: 2019-11-21 | Discharge: 2019-11-21 | Disposition: A | Discharge status: Home / Self Care | Payer: Medicare Other | Source: Ambulatory Visit | Attending: Hematology & Oncology | Admitting: Hematology & Oncology

## 2019-11-21 DIAGNOSIS — C61 Malignant neoplasm of prostate: Secondary | ICD-10-CM | POA: Insufficient documentation

## 2019-11-21 DIAGNOSIS — C772 Secondary and unspecified malignant neoplasm of intra-abdominal lymph nodes: Secondary | ICD-10-CM | POA: Insufficient documentation

## 2019-11-21 MED ORDER — MIDAZOLAM HCL 1 MG/ML IJ SOLN (WRAP)
INTRAMUSCULAR | Status: AC | PRN
Start: 2019-11-21 — End: 2019-11-21
  Administered 2019-11-21: 0.5 mg via INTRAVENOUS

## 2019-11-21 MED ORDER — FENTANYL CITRATE (PF) 50 MCG/ML IJ SOLN (WRAP)
INTRAMUSCULAR | Status: AC | PRN
Start: 2019-11-21 — End: 2019-11-21
  Administered 2019-11-21: 50 ug via INTRAVENOUS

## 2019-11-21 MED ORDER — FENTANYL CITRATE (PF) 50 MCG/ML IJ SOLN (WRAP)
INTRAMUSCULAR | Status: AC | PRN
Start: 2019-11-21 — End: 2019-11-21
  Administered 2019-11-21: 25 ug via INTRAVENOUS

## 2019-11-21 MED ORDER — ONDANSETRON HCL 4 MG/2ML IJ SOLN
INTRAMUSCULAR | Status: AC | PRN
Start: 2019-11-21 — End: 2019-11-21
  Administered 2019-11-21: 4 mg via INTRAVENOUS

## 2019-11-21 MED ORDER — SODIUM CHLORIDE 0.9 % IV SOLN
INTRAVENOUS | Status: DC
Start: 2019-11-21 — End: 2019-11-22

## 2019-11-21 MED ORDER — LIDOCAINE HCL 1 % IJ SOLN
INTRAMUSCULAR | Status: AC | PRN
Start: 2019-11-21 — End: 2019-11-21
  Administered 2019-11-21: 1 mL via SUBCUTANEOUS

## 2019-11-21 NOTE — Procedures (Signed)
History: Retroperitoneal lymph node. History of prostate cancer.    COMPARISON: 11/13/2019    PROCEDURE: After discussion of risks and benefits of the procedure with the patient informed was obtained. A procedure pause was performed. The patient's identity was verified with 2 unique identifiers A preliminary CT was obtained in the prone position for localization. The skin over the biopsy site was prepped draped in the usual sterile fashion. 1% lidocaine was used for local anesthesia. Fentanyl and Versed were administered under appropriate monitoring for conscious sedation. A 17-gauge introducer needle was advanced to the edge of the lesion and multiple 18-gauge core biopsies were obtained. Pathology was in attendance and felt the specimen was adequate for diagnosis. There were no immediate complications. Estimated blood loss none, preprocedure diagnosis lymphadenopathy. Post procedure diagnosis pathology pending. Radiologist Jamariyah Johannsen.    IMPRESSION:    Successful CT-guided left periaortic lymph node biopsy.

## 2019-11-21 NOTE — Discharge Instructions (Signed)
Interventional Radiology  Discharge Instructions following Biopsy    You have had a retroperitoneal lymph node biopsy performed by Dr. Tana Felts on November 21, 2019.    A biopsy is a small sample of tissue or fluid taken from the body.   Image-guided biopsy allows an Interventional Radiologist take a sample from an organ or mass without surgery.  This sample can then be studied in a laboratory.     General Instructions:  1. Rest today post procedure.  2. Avoid aspirin and aspirin-like products (NSAIDS - Motrin, Ibuprofen, Advil, Aleve, Naproxen) for the next three days unless otherwise instructed by your doctor.  3. No heavy lifting (greater than 5 pounds) or straining for the next 72 hours (3 days).  4. No driving for 24 hours post procedure due to medication/sedation you may have received during your procedure.  5. Avoid alcohol for the next 24 hours after the procedure if you received sedation.  6. No swimming, or soaking in a bath tub or hot tub.    Site Care:   1. You may shower and remove the dressing tomorrow.    2. You may leave the site uncovered or replace with a new dry dressing or Band-Aid each day until healed.  3. Do NOT use creams, powders, lotions, or ointments at the site.   4. Bruising sometimes occurs at the site where the needle was inserted.    Call  the Interventional Radiologist if you observe:  1. Prolonged oozing of blood from the biopsy site  2. Pain at the site for more than 3 days    3. Increased pain or unrelieved pain  4. Dizziness or lightheadedness  5. Difficulty breathing or shortness of breath  6. Redness, warmth to touch, or discharge from biopsy site  7. Coughing up blood (more than 3 teaspoons of blood is significant)  8. Chest pain    If you have questions or concerns, please call an Interventional Radiologist:    Contact Numbers  Regular business hours (8A-5P M-F):  [facility] [place] [facility]):  [phone] option [phone]  [facility]:  [phone]  [facility]: [phone]    After hours:  Answering service:  [phone]

## 2019-11-21 NOTE — H&P (Signed)
BRIEF PRE PROCEDURE H&P      PROCEDURALIST COMMENTS BELOW:   CT guided L retro LN BX      INDICATIONS:   Prostate CA, retro LN      REVIEW OF SYSTEMS:   YES  ( x )       ALLERGIES:     NO  (  )   YES  ( x)          PHYSICAL EXAM     AIRWAY CLASSIFICATION:    CLASS I   (  )     CLASS II  ( x)    CLASS III  (  )     CLASS IV  (  )    CARDIAC :   (x  )  RRR  (  )  IRREG  (  )  MURMUR      LUNGS:   ( x )  CLEAR  (  )  DIMINISHED    (  ) LEFT   (  )  RIGHT  (  )  ABSENT          (  ) LEFT   (  )  RIGHT  (  )  TUBES            (  ) LEFT   (  )  RIGHT          ABDOMEN:   neg      NEURO:   neg      OTHER:   neg    LABS:     Lab Results   Component Value Date/Time    WBC 4.93 11/20/2019 01:21 PM    HCT 38.3 11/20/2019 01:21 PM    PLT 220 11/20/2019 01:21 PM    BUN 22.0 08/17/2019 09:34 AM    GLU 121 (H) 08/17/2019 09:34 AM    K 3.7 08/17/2019 09:34 AM           ASA PHYSICAL STATUS   (  )  ASA 1   HEALTHY PATIENT  ( x )  ASA 2   MILD SYSTEMIC ILLNESS  (  )  ASA 3   SYSTEMIC DISEASE, NOT INCAPACITATING  (  )  ASA 4   SEVERE SYSTEMIC DISEASE, DISEASE IS CONSTANT THREAT TO                         LIFE  (  )  ASA 5   MORIBUND CONDITION, NOT EXPECTED TO LIVE >24 HOURS            IRRESPECTIVE OF PROCEDURE  (  )  E           EMERGENCY PROCEDURE       PLANNED SEDATION:   ( x ) NO SEDATION  (  ) MODERATE SEDATION  (  ) DEEP SEDATION WITH ANESTHESIA      CONCLUSION:   PATIENT HAS BEEN REASSESSED IMMEDIATELY PRIOR TO THE PROCEDURE   AND IS AN APPROPRIATE CANDIDATE FOR THE PLANNED SEDATION AND   PROCEDURE.  RISKS, BENEFITS AND ALTERNATIVES TO THE PLANNED   PROCEDURE AND SEDATION HAVE BEEN EXPLAINED TO THE PATIENT   OR GUARDIAN.    ( x )  YES  (  )  EMERGENCY CONSENT         Signed by Lauralee Evener.

## 2019-11-21 NOTE — Progress Notes (Signed)
Patient has been cleared for discharge home after having retroperitoneal lymph node biopsy in CT scan. All post op testing has been completed. Patient has tolerated food and drink without nausea. All discharge instructions given to patient and family. All questions answered.  Peripheral IV removed. Patient discharged home via wheelchair with daughter [name].

## 2019-11-21 NOTE — Sedation Documentation (Signed)
Dressing to back, to stretcher, to recovery, report on 8 pass biopsy

## 2019-11-21 NOTE — Sedation Documentation (Signed)
Dr Tana Felts aware of Solarcaine allergy prior to procedure

## 2019-11-21 NOTE — Sedation Documentation (Signed)
In CT, voided, consent with daughter, prone O2 at 4 lpm, sat 875 on RA prone, sedation and pain medications, scan

## 2019-11-21 NOTE — Sedation Documentation (Signed)
Awaiting consent

## 2019-11-23 ENCOUNTER — Encounter (INDEPENDENT_AMBULATORY_CARE_PROVIDER_SITE_OTHER): Payer: Self-pay

## 2019-11-23 ENCOUNTER — Telehealth (INDEPENDENT_AMBULATORY_CARE_PROVIDER_SITE_OTHER): Payer: Medicare Other | Admitting: Urology

## 2019-11-23 ENCOUNTER — Encounter (INDEPENDENT_AMBULATORY_CARE_PROVIDER_SITE_OTHER): Payer: Self-pay | Admitting: Urology

## 2019-11-23 DIAGNOSIS — R3912 Poor urinary stream: Secondary | ICD-10-CM

## 2019-11-23 DIAGNOSIS — R351 Nocturia: Secondary | ICD-10-CM

## 2019-11-23 DIAGNOSIS — N2889 Other specified disorders of kidney and ureter: Secondary | ICD-10-CM

## 2019-11-23 DIAGNOSIS — C61 Malignant neoplasm of prostate: Secondary | ICD-10-CM

## 2019-11-23 DIAGNOSIS — E278 Other specified disorders of adrenal gland: Secondary | ICD-10-CM

## 2019-11-23 DIAGNOSIS — R9721 Rising PSA following treatment for malignant neoplasm of prostate: Secondary | ICD-10-CM

## 2019-11-23 LAB — LAB USE ONLY - HISTORICAL SURGICAL PATHOLOGY

## 2019-11-23 NOTE — Patient Instructions (Addendum)
1. Make an appointment with PCP for sleep apnea   2. Stop the oxybutynin, then let myrbetriq run out.   3.  MRI of abdomen    Send me a message through the MyChart next week to let know what happened with urination        --------------  I appreciate the opportunity to help take care of you and your family. Here is some basic housekeeping:      1) The FASTEST way to receive results and communicate with our office is via the portal, MyChart.  Please make sure you sign up and have an active account.    2) Please be aware that I often send results/next steps via MyChart. Please check your account regularly.  Occasionally notes that I send do not trigger email warnings, if you have not heard from me regarding pending results please check MyChart prior to calling the office.    3) There are some results that take up to 10 business days to be received and/or processed. If you have not heard from me regarding results in 7-10 business days please feel free to call [phone]) or send a message via MyChart.     4) Messages left in the general voice mailbox are not always able to be returned the same day.  If you have an urgent matter, please speak to a nurse or secretary directly. If you are having difficulty getting through to office staff please send a message via MyChart.    5) If you have surgical questions please call Stefanie for surgical planning and follow-up [phone]        This note was generated by the Epic EMR system/ Dragon speech recognition and may contain inherent errors or omissions not intended by the user. Grammatical errors, random word insertions, deletions, pronoun errors and incomplete sentences are occasional consequences of this technology due to software limitations. Not all errors are caught or corrected. If there are questions or concerns about the content of this note or information contained within the body of this dictation they should be addressed directly with the author for  clarification.

## 2019-11-23 NOTE — Progress Notes (Signed)
Subjective:      Patient ID: Tanner Lewis is a 79 y.o. male     Chief Complaint:  Verbal consent has been obtained from the patient to conduct a video and telephone visit to minimize exposure to COVID-19: yes      11/21/2019-CT-guided retroperitoneal lymph node biopsy  08/09/2019- office cystoscopy- enlarged lateral lobes     1. Rising PSA-now 10.5  2. Prostate cancer- seeds  (2006); intermittent ADT since 2010/11 until prior to Covid  3. Urinary frequency/urgency - pt is taking myrbetriq and Ditropan  4. Nocturia- 3-5 times thought the night   5. Right renal cyst-    Pt has noticed urinary frequency was not significantly improved with myrbetriq.  Patient has been taking Ditropan as well for years.    Patient and his daughter report that he has had worsening memory changes in the past year.    Denies gross hematuria or dysuria    Recently moved moved in the area.  Previously followed by Dr. Andrey Campanile        The following portions of the patient's history were reviewed and updated as appropriate: allergies, current medications, past family history, past medical history, past social history, past surgical history and problem list.    Review of Systems  Systems reviewed per the HPI and below:     History obtained from the patient     General ROS: no fevers, or chills     Gastrointestinal ROS: no abdominal pain, change in bowel habits     Musculoskeletal ROS:  no swelling to lower extremities, no back pain     Objective:   TELEMEDICINE VISIT    Physical Exam   Constitutional:  Well-developed, well-nourished, and in no distress.   Pulmonary/Chest: Effort normal.   Neurological: Pt is alert and oriented to person, place, and time.    Psychiatric: Mood, memory, affect and judgment normal.       Lab Review   Reviewed results as listed below. Discussed findings with patient.     08/17/2019- PSA- 10,5   05/11/2019- PSA- 3.2     Radiology Review   Reviewed results as listed below. Discussed results with the patient and answered  questions to the best of my ability.     10/22/2019- ct chest/abd/pelvis-contrast CT scan with bilateral adrenal masses, pelvic lymphadenopathy, complex right renal cyst      Assessment:     1. 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 Rising PSA following treatment for malignant neoplasm of prostate    3. Nocturia    4. Weak urinary stream    5. Right renal mass    6. Mass of both adrenal glands        -MRI of abdomen to better evaluate renal and adrenal lesions  -Concern for mental status changes will discuss with Dr. Cathie Hoops regarding possible need for CT brain  -Patient should stop oxybutynin  -Pending results of stopping oxybutynin would also consider stopping Myrbetriq as it does not seem to be helping the patient's symptoms  -Awaiting results of biopsy    -Patient seems to be waking multiple times per night and does have snoring would also recommend an apnea work-up    -We will reduce medication and start again with treatment for bothersome urinary symptoms    Pts medical records from Epic, Dr. Cathie Hoops and PCP  were reviewed.       Visit was conducted using vidyo     Plan:  Patient Instructions   1. Make an appointment with PCP for sleep apnea   2. Stop the oxybutynin, then let myrbetriq run out.     Send me a message through the MyChart next week to let know what happened with urination        Orders  Orders Placed This Encounter   Procedures   . MRI Abdomen W WO Contrast     Standing Status:   Future     Standing Expiration Date:   11/22/2020     Order Specific Question:   What is the patient's sedation requirement?     Answer:   No Sedation     Order Specific Question:   Does the patient have a pacemaker or defibrillator?     Answer:   No     Order Specific Question:   Clinical info for radiologist     Answer:   Renal and adrenal lesion, rising PSA status post prostate cancer treatment     Order Specific Question:   Release to patient     Answer:   Immediate

## 2019-11-26 ENCOUNTER — Telehealth: Payer: Self-pay

## 2019-11-26 NOTE — Telephone Encounter (Signed)
Reviewed with Dr. Cathie Hoops. Please schedule patient for an office visit on 7/1 at 10 am.

## 2019-11-26 NOTE — Telephone Encounter (Signed)
Reviewed with Dr. Anice Paganini with patient's daughter, [name]. Informed her that Dr. Cathie Hoops would like to see the patient on Thursday, 7/1, to review the pathology report from his recent biopsy and discuss his plan of care and next steps. [name] prefers to have an appointment on 7/1 at 10 am. Will notify Patient Access Team about appointment. She agreed and verbalized understanding.

## 2019-11-29 ENCOUNTER — Ambulatory Visit: Payer: Self-pay

## 2019-11-29 ENCOUNTER — Encounter: Payer: Self-pay | Admitting: Hematology & Oncology

## 2019-11-29 ENCOUNTER — Other Ambulatory Visit: Payer: Medicare Other

## 2019-11-29 ENCOUNTER — Ambulatory Visit: Payer: Medicare Other | Attending: Hematology & Oncology | Admitting: Hematology & Oncology

## 2019-11-29 ENCOUNTER — Other Ambulatory Visit (FREE_STANDING_LABORATORY_FACILITY): Payer: Medicare Other

## 2019-11-29 ENCOUNTER — Other Ambulatory Visit: Payer: Self-pay

## 2019-11-29 VITALS — BP 161/83 | HR 63 | Temp 97.4°F | Wt 169.0 lb

## 2019-11-29 DIAGNOSIS — R4189 Other symptoms and signs involving cognitive functions and awareness: Secondary | ICD-10-CM | POA: Insufficient documentation

## 2019-11-29 DIAGNOSIS — C772 Secondary and unspecified malignant neoplasm of intra-abdominal lymph nodes: Secondary | ICD-10-CM | POA: Insufficient documentation

## 2019-11-29 DIAGNOSIS — Z79818 Long term (current) use of other agents affecting estrogen receptors and estrogen levels: Secondary | ICD-10-CM

## 2019-11-29 DIAGNOSIS — C61 Malignant neoplasm of prostate: Secondary | ICD-10-CM

## 2019-11-29 DIAGNOSIS — R399 Unspecified symptoms and signs involving the genitourinary system: Secondary | ICD-10-CM | POA: Insufficient documentation

## 2019-11-29 DIAGNOSIS — M8580 Other specified disorders of bone density and structure, unspecified site: Secondary | ICD-10-CM | POA: Insufficient documentation

## 2019-11-29 LAB — COMPREHENSIVE METABOLIC PANEL
ALT: 22 U/L (ref 0–55)
AST (SGOT): 26 U/L (ref 5–34)
Albumin/Globulin Ratio: 1 (ref 0.9–2.2)
Albumin: 4 g/dL (ref 3.5–5.0)
Alkaline Phosphatase: 45 U/L (ref 38–106)
Anion Gap: 10 (ref 5.0–15.0)
BUN: 16 mg/dL (ref 9.0–28.0)
Bilirubin, Total: 1 mg/dL (ref 0.1–1.2)
CO2: 25 mEq/L (ref 21–29)
Calcium: 9.1 mg/dL (ref 7.9–10.2)
Chloride: 107 mEq/L (ref 100–111)
Creatinine: 1.5 mg/dL (ref 0.5–1.5)
Globulin: 4 g/dL — ABNORMAL HIGH (ref 2.0–3.7)
Glucose: 107 mg/dL — ABNORMAL HIGH (ref 70–100)
Potassium: 3.9 mEq/L (ref 3.5–5.1)
Protein, Total: 8 g/dL (ref 6.0–8.3)
Sodium: 142 mEq/L (ref 136–145)

## 2019-11-29 LAB — GFR: EGFR: 54.6

## 2019-11-29 LAB — PSA: Prostate Specific Antigen, Total: 33.241 ng/mL — ABNORMAL HIGH (ref 0.000–4.000)

## 2019-11-29 MED ORDER — BICALUTAMIDE 50 MG PO TABS
50.0000 mg | ORAL_TABLET | Freq: Every day | ORAL | 0 refills | Status: AC
Start: 2019-11-29 — End: 2019-12-29

## 2019-11-29 NOTE — Progress Notes (Signed)
[facility] New Patient Nurse Navigator  11/29/2019    Met with patient and his daughters following his appointment with Dr. Cathie Hoops. Reviewed plan of care. The patient will complete staging and begin ADT.    Pt will have labs drawn this afternoon. Orders in place. Instructed patient to go to the lab on the first floor after leaving clinic. Bone scan order in place, scheduled for 8/3. Patient will repeat labs same day.    Casodex prescription sent to patient's pharmacy per Dr. Cathie Hoops. Reviewed instructions to begin taking as soon as possible, and complete 30 day course. Patient scheduled for first eligard injection 7/16. Reviewed common side effects, provided written and verbal education about both casodex and eligard.    The patient will follow up with Dr. Cathie Hoops on 8/13. He and his daughters verbalize understanding of the plan of care, they deny further questions at this time.    Benedict Needy, RN, BSN, OCN  Oncology Nurse Navigator  [facility]  [phone]

## 2019-11-29 NOTE — Progress Notes (Signed)
Provider:  Dr. Cathie Hoops  Diagnosis & Code:  Prostate cancer (C61)  Regimen:  eligard (6 months)  Dosing/Frequency/Duration:    eligard 45mg  q 6 months  Growth Factor? (if Y, specify):  no  NCCN?:    YES/NCCN (needs 6 days for tx plan/auth)   Treatment Location: [facility]  Start Date:  12/14/2019    Notes:    >See Provider's recent Note.  >>Please confirm when tx plan has been entered.

## 2019-11-29 NOTE — Progress Notes (Signed)
[facility] FOLLOW-UP NOTE      Date of service: November 29, 2019    Subjective:   Chief complaint: Tanner Lewis is a 79 y.o. year old male who returns for follow-up of metastatic prostate cancer.     History of Present Illness:  Tanner Lewis presents with his two daughters today to discuss the results of his recent lymph node biopsy. He has been in his usual state of health since I last saw him in March. He does experience occasional mild abdominal discomfort very sporadically. He denies any bone pain or weight loss. Admits to the fact that he is not very active. Urinary frequency d/t overactive bladder remains unchanged in spite of taking mirabegron, oxybutynin, and tamsulosin.       Oncology History:     Oncology History Overview Note   Adenocarcinoma of the prostate, stage IV (TX N1 M1a)  . Original diagnosis in 2006. Initial PSA was 6.96 ng/ml.   . Prostate biopsy on 02/18/05 confirmed a prostatic adenocarcinoma, Gleason 3+3=6, associated high-grade PIN.  . S/P 125-I seed implant on 04/25/2005.  [name] Developed biochemical recurrence, treated with ADT (Casodex/Lupron) from Dec 2011 to March 2013.   [name] PSA 2.22 ng/ml on 09/09/2014.  [name] Bone scan on 10/08/14 was negative for e/o bone metastases.   . CT abd/pel on 10/08/14 showed "stable bilateral adrenal nodules and multiple prominent retroperitoneal adenopathy with the largest measuring 1.6 x 1.1 cm in the right iliac chain. There was also a new enlarged lymph node along the right pelvic sidewall measuring 1.4 x 1 cm. The retroperitoneal lymph nodes appear to be stable in appearance. The max dimension of the largest lymph node is 1.6 x 0.8 cm.   . MRI prostate on 11/13/14 demonstrated a local recurrence in the left lobe of the prostate measuring 2.6 x 1.5 x 1.6 cm with deformation of the prostate capsule and likely extracapsular spread. There was a 1.5 x 1.2 cm right iliac chain lymph node, a 1.3 x 1.3 cm common iliac chain lymph node, additional smaller right iliac  chain lymph nodes and right common iliac chain lymph nodes.   . Began ADT with Lupron q6 mo again on 11/25/2014.   [name] PSA on 11/25/14 was 33.67 ng/ml; 2.18 ng/ml on 02/05/15; 1.65 in Nov 2016; 1.34 on 05/30/15.   [name] Patient was lost to follow-up and did not receive another dose of Lupron since June 2016.  [name] CT chest on 07/24/15 negative for e/o metastatic disease.   . Lupron was resumed on 12/21/16 and stopped again sometime in 2019.   [name] PSA 1.62 on 06/08/17; 1.4 ng/ml on 06/20/17;   . Established care with local PCP on 05/11/2019 and a PSA was found to be 3.217 ng/ml. Referred to urology for urinary frequency.   . Renal US on 08/08/2019 was unremarkable.  . Establish care with med oncology on 08/16/2019. PSA of 10.51 ng/ml on 08/17/2019.   . CT c/a/p on 10/22/2019 showed a borderline enlarged AP window node which measures 1.4 x 1.4 cm. Indeterminate enhancing lesion in the right hepatic dome measures 1.9 x 1.7 cm. Indeterminate bilateral adrenal masses, 2.9 x 1.4 cm on right and 1.8 x 1.2 cm on left. Bilateral renal cysts noted, including a complex cyst in the upper pole right kidney which measures 4.2 x 3.0 cm, containing either internal septation or internal vessel. There is a more simple appearing cyst in the mid left kidney which measures 4.9 x 4.3 cm. Bladder wall mildly  thickened. Multiple prostatic radiation seeds noted. Unremarkable seminal vesicles. There is bulky abdominal lymphadenopathy. A left periaortic lymph node measures 2.7 x 2.2 cm. Retroaortic lymph node just below the aortic bifurcation measures 2.3 x 2.0 cm. There is no pelvic lymphadenopathy. No bone lesions.   [name] MRI abdomen w/wo contrast on 11/13/2019 showed right liver lesion c/w hemangioma. Bilateral adrenal nodules consistent with adenomas. Renal lesions c/w cysts. Retroperitoneal adenopathy is partially imaged. For example a left periaortic lymph node measuring 3.1 x 2.6 cm.  . S/P CT-guided biopsy of retroperitoneal lymph node on 11/21/2019. Pathology  consistent with a metastatic poorly-differentiated adenocarcinoma. There is comedonecrosis that can be seen in Gleason pattern 5 prostatic adenocarcinoma or colorectal adenocarcinoma. Immunostains show that the neoplastic cells are positive for NKX-3.1 and CDX-2, negative for CK7 and CK20, confirming the diagnosis of metastatic prostatic adenocarcinoma.           Past Medical History:  Past Medical History:   Diagnosis Date   . Hypertension    . Prostate cancer 2010       Patient Active Problem List   Diagnosis   . Essential hypertension   . History of prostate cancer   . Fatigue, unspecified type   . Keeps losing balance   . Age-related cataract of both eyes, unspecified age-related cataract type   . 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 Nocturia   . Weak urinary stream   . Rising PSA following treatment for malignant neoplasm of prostate   . Right renal mass   . Mass of both adrenal glands       Medications:  Current Outpatient Medications   Medication Sig Dispense Refill   . amlodipine-benazepril (LOTREL) 10-20 MG per capsule Take 1 capsule by mouth daily Take one tablet by mouth once daily. 90 capsule 1   . Apoaequorin (Prevagen) 10 MG Cap Take by mouth One capsule once daily.     . hydrALAZINE (APRESOLINE) 50 MG tablet Take 1 tablet (50 mg total) by mouth 3 (three) times daily Take 1 tablet by mouth three times daily 270 tablet 1   . isosorbide dinitrate (ISORDIL) 20 MG tablet Take 1 tablet (20 mg total) by mouth 2 (two) times daily 180 tablet 1   . Mirabegron ER 50 MG Tablet SR 24 hr Take 1 tablet (50 mg total) by mouth daily 30 tablet 2   . Misc Natural Products (PROSTATE SUPPORT PO) Take by mouth Twice daily.     . simvastatin (ZOCOR) 20 MG tablet Take 1 tablet (20 mg total) by mouth nightly Take one tablet by mouth at bedtime 90 tablet 1   . tamsulosin (FLOMAX) 0.4 MG Cap Take 1 capsule (0.4 mg total) by mouth Daily after dinner  Take 1 capsule by mouth daily. 90 capsule 1   . venlafaxine (EFFEXOR) 75 MG tablet Take 1 tablet (75 mg total) by mouth daily 90 tablet 1   . bicalutamide (CASODEX) 50 MG tablet Take 1 tablet (50 mg total) by mouth daily 30 tablet 0   . oxybutynin (DITROPAN) 5 MG tablet Take 1 tablet (5 mg total) by mouth 3 (three) times daily 270 tablet 1     No current facility-administered medications for this visit.       Allergies, Social History, and Family History were reviewed and updated.      Tobacco Dependence:  Social History     Tobacco Use  Smoking Status Former Smoker   . Packs/day: 1.50   . Years: 20.00   . Pack years: 30.00   . Types: Cigarettes   . Quit date: 05/10/2014   . Years since quitting: 5.5   Smokeless Tobacco Never Used       Smoking Cessation Counseling:  Date: November 29, 2019  N/A    Physical Exam:  Vitals:    11/29/19 1018   BP: 161/83   Pulse: 63   Temp: 97.4 F (36.3 C)   SpO2: 98%     Body surface area is 1.9 meters squared.  ECOG performance status: 0-1  Psychosocial: Appropriate affect. Supportive daughters. Recent concerns regarding memory loss and cognitive deficits.    General Appearance:  Alert, cooperative, in no distress, appears stated age  Head:  Normocephalic without obvious abnormalities, atraumatic  Eyes: Intact EOM. No scleral icterus, conjunctiva/corneas clear  Nose: Deferred, wearing mask  Throat:  Deferred, wearing mask  Neck: Supple, symmetrical, trachea midline  Lungs: Clear to auscultation bilaterally, respirations are unlabored, excursion symmetrical  Heart: Regular rate and rhythm, S1 and S2 normal, no murmurs, no tachycardia  Abdomen: Soft, non-tender, non-distended, bowel sounds normoactive, no palpable masses or organomegaly  Genitourinary: No costovertebral tenderness  Extremities: No cyanosis or edema  Skin: Normal color and turgor, no rashes  Musculoskeletal: No spinal or paraspinal tenderness  Neurologic: Cranial nerves grossly intact, no focal motor deficits, normal  speech and mentation, normal gait       Labs:     Recent Results (from the past 2688 hour(s))   Urinalysis POC    Collection Time: 08/09/19  2:39 PM   Result Value Ref Range    POCT Urine Color Yellow Yellow    POCT Urine Clarity Clear Clear - Hazy    POCT Urine pH 7.0 5.0 - 8.0    Urine leukocyte Esterase, POCT Negative Negative    POCT Urine Nitrites Negative Negative    Protein, UR POCT 100 (A) Negative mg/dL    POCT Urine Glucose Negative Negative mg/dL    POCT Urine Ketones Negative Negative mg/dL    POCT Urine Urobilibogen 0.2 0.0 - 1.0 mg/dL    POCT Urine Bilirubin Negative Negative    Blood, UA POCT Negative Negative    Urine Specific Gravity POC 1.020 1.001 - 1.035   T4, free    Collection Time: 08/17/19  9:34 AM   Result Value Ref Range    T4 Free 1.00 0.70 - 1.48 ng/dL   T3, free    Collection Time: 08/17/19  9:34 AM   Result Value Ref Range    T3, Free 2.97 1.71 - 3.71 pg/mL   TSH    Collection Time: 08/17/19  9:34 AM   Result Value Ref Range    TSH 0.39 0.35 - 4.94 uIU/mL   CBC and differential    Collection Time: 08/17/19  9:34 AM   Result Value Ref Range    WBC 5.33 3.1 - 9.5 x10 3/uL    Hgb 13.2 12.5 - 17.1 g/dL    Hematocrit 16.1 09.6 - 49.6 %    Platelets 237 142 - 346 x10 3/uL    RBC 4.27 4.20 - 5.90 x10 6/uL    MCV 94.6 78.0 - 96.0 fL    MCH 30.9 25.1 - 33.5 pg    MCHC 32.7 31 - 35 g/dL    RDW 14 04.5 - 40.9 %    MPV 9.6 8.9 - 12.5 fL    Neutrophils  44.1 None %    Lymphocytes Automated 41.8 None %    Monocytes 11.4 None %    Eosinophils Automated 2.1 None %    Basophils Automated 0.4 None %    Immature Granulocytes 0.2 None %    Nucleated RBC 0.0 0.0 - 0.0 /100 WBC    Neutrophils Absolute 2.35 1 - 6 x10 3/uL    Lymphocytes Absolute Automated 2.23 0.42 - 3.22 x10 3/uL    Monocytes Absolute Automated 0.61 0.21 - 0.85 x10 3/uL    Eosinophils Absolute Automated 0.11 0.00 - 0.44 x10 3/uL    Basophils Absolute Automated 0.02 0.00 - 0.08 x10 3/uL    Immature Granulocytes Absolute 0.01 0.00 - 0.07 x10  3/uL    Absolute NRBC 0.00 0.00 - 0.00 x10 3/uL   Comprehensive metabolic panel    Collection Time: 08/17/19  9:34 AM   Result Value Ref Range    Glucose 121 (H) 70 - 100 mg/dL    BUN 04.5 9 - 28 mg/dL    Creatinine 1.7 (H) 0.5 - 1.5 mg/dL    Sodium 409 811 - 914 mEq/L    Potassium 3.7 3.5 - 5.1 mEq/L    Chloride 102 100 - 111 mEq/L    CO2 27 21 - 29 mEq/L    Calcium 9.1 7.9 - 10.2 mg/dL    Protein, Total 7.4 6.0 - 8.3 g/dL    Albumin 3.9 3.5 - 5.0 g/dL    AST (SGOT) 28 5 - 34 U/L    ALT 28 0 - 55 U/L    Alkaline Phosphatase 59 38 - 106 U/L    Bilirubin, Total 0.5 0.2 - 1.2 mg/dL    Globulin 3.5 2.0 - 3.7 g/dL    Albumin/Globulin Ratio 1.1 0.9 - 2.2    Anion Gap 9.0 5 - 15   PSA    Collection Time: 08/17/19  9:34 AM   Result Value Ref Range    Prostate Specific Antigen, Total 10.508 (H) 0.000 - 4.000 ng/mL   Testosterone    Collection Time: 08/17/19  9:34 AM   Result Value Ref Range    Testosterone 428 241 - 827 ng/dL   Hemolysis index    Collection Time: 08/17/19  9:34 AM   Result Value Ref Range    Hemolysis Index 4 0 - 18   GFR    Collection Time: 08/17/19  9:34 AM   Result Value Ref Range    EGFR 39.1    Whole Blood Creatinine with GFR POCT    Collection Time: 10/22/19  8:45 AM   Result Value Ref Range    Whole Blood Creatinine POCT 1.0 0.5 - 1.1 mg/dL    GFR POCT >78 >29 FA/OZH/0.86 m2   CBC and differential    Collection Time: 11/20/19  1:21 PM   Result Value Ref Range    WBC 4.93 3.1 - 9.5 x10 3/uL    Hgb 12.5 12.5 - 17.1 g/dL    Hematocrit 57.8 46.9 - 49.6 %    Platelets 220 142 - 346 x10 3/uL    RBC 4.08 (L) 4.20 - 5.90 x10 6/uL    MCV 93.9 78.0 - 96.0 fL    MCH 30.6 25.1 - 33.5 pg    MCHC 32.6 31 - 35 g/dL    RDW 14 62.9 - 52.8 %    MPV 9.6 8.9 - 12.5 fL    Neutrophils 46.4 None %    Lymphocytes Automated 39.8 None %  Monocytes 11.8 None %    Eosinophils Automated 1.4 None %    Basophils Automated 0.4 None %    Immature Granulocytes 0.2 None %    Nucleated RBC 0.0 0.0 - 0.0 /100 WBC    Neutrophils  Absolute 2.29 1 - 6 x10 3/uL    Lymphocytes Absolute Automated 1.96 0.42 - 3.22 x10 3/uL    Monocytes Absolute Automated 0.58 0.21 - 0.85 x10 3/uL    Eosinophils Absolute Automated 0.07 0.00 - 0.44 x10 3/uL    Basophils Absolute Automated 0.02 0.00 - 0.08 x10 3/uL    Immature Granulocytes Absolute 0.01 0.00 - 0.07 x10 3/uL    Absolute NRBC 0.00 0.00 - 0.00 x10 3/uL   .    Rads:   MRI Brain W WO Contrast    Result Date: 11/14/2019    1. No acute process. 2. No mass, hydrocephalus, or pathologic fluid collection. 3. No acute infarct. 4. Chronic ischemic changes, scattered old microhemorrhages, scattered old lacunar infarcts are present. Electronically signed by: Trilby Drummer M.D.  [Interpreted at: [facility] BC: 11/14/19    MRI abdomen with and without contrast    Result Date: 11/13/2019   1. 2.3 x 2 cm right hepatic lesion suggestive of a hemangioma or other vascular lesion. Follow-up abdomen MRI with and without contrast can be obtained in 4-6 months. 2. Bilateral adrenal nodules most compatible with adenomas. 3. Retroperitoneal adenopathy. 4. Renal cysts. Electronically signed by: Kennyth Lose M.D.  [Interpreted at: [facility] MS: 11/13/19    CT Guided Biopsy Retroperitoneum Pq Ndl    Result Date: 11/21/2019  Successful CT-guided left periaortic lymph node biopsy. Rocky Crafts, MD  11/21/2019 11:24 AM    CT c/a/p on 10/22/2019 showed a borderline enlarged AP window node which measures 1.4 x 1.4 cm. Indeterminate enhancing lesion in the right hepatic dome measures 1.9 x 1.7 cm. Indeterminate bilateral adrenal masses, 2.9 x 1.4 cm on right and 1.8 x 1.2 cm on left. Bilateral renal cysts noted, including a complex cyst in the upper pole right kidney which measures 4.2 x 3.0 cm, containing either internal septation or internal vessel. There is a more simple appearing cyst in the mid left kidney which measures 4.9 x 4.3 cm. Bladder wall mildly thickened. Multiple  prostatic radiation seeds noted. Unremarkable seminal vesicles. There is bulky abdominal lymphadenopathy. A left periaortic lymph node measures 2.7 x 2.2 cm. Retroaortic lymph node just below the aortic bifurcation measures 2.3 x 2.0 cm. There is no pelvic lymphadenopathy. No bone lesions.     DEXA scan on 10/22/2019 consistent with osteopenia of the left femoral neck. Normal BMD of spine.     Assessment:     1. 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 Androgen deprivation therapy    3. Osteopenia due to cancer therapy    4. Lower urinary tract symptoms (LUTS)    5. Cognitive deficits       Recommendations:     1. Adenocarcinoma of the prostate, stage IV (TX N1 M1a). Original diagnosis in 2006. Initial PSA was 6.96 ng/ml and prostate biopsy on 02/18/05 confirmed a Gleason 6 prostatic cancer. S/P brachytherapy on 04/25/2005. Developed biochemical recurrence in 2011 and was treated with ADT from Dec 2011 to March 2013. PSA trends during this time unknown (no records available). PSA noted to be 2.22 ng/ml on 09/09/2014. Staging CT and bone scans on 10/08/14 notable for "stable bilateral adrenal nodules and  multiple prominent retroperitoneal adenopathy with the largest measuring 1.6 x 1.1 cm in the right iliac chain. There was also a new enlarged lymph node along the right pelvic sidewall measuring 1.4 x 1 cm. The retroperitoneal lymph nodes appear to be stable in appearance. The max dimension of the largest lymph node is 1.6 x 0.8 cm." MRI prostate on 11/13/14 demonstrated a "local recurrence in the left lobe of the prostate measuring 2.6 x 1.5 x 1.6 cm with deformation of the prostate capsule and likely extracapsular spread. There was a 1.5 x 1.2 cm right iliac chain lymph node, a 1.3 x 1.3 cm common iliac chain lymph node, additional smaller right iliac chain lymph nodes and right common iliac chain lymph nodes."  Lupron q6 mo was resumed on 11/25/2014 after which PSA decreased from 33.67 ng/ml down to  a nadir of 1.34 by 05/30/15. He was then lost to follow-up until he received another dose of Lupron in July 2018 and had been off ADT again since 2019 prior to seeing me here in March 2021. PSA has recently trended as follows: 1.62 on 06/08/17; 1.4 ng/ml on 06/20/17; 3.22 on 05/11/2019; and up to 10.5 on 08/17/2019. Since his last appt here:   CT c/a/p done on 10/22/2019 remarkable again for retroperitoneal adenopathy. Lesions also noted in liver, adrenal glands, and kidneys later determined to be hemangioma, adrenal adenomas, and cysts, respectively on abdominal MRI on 6/15.    CT-guided retroperitoneal LN biopsy on 11/21/2019 confirmed diagnosis of metastatic prostate cancer.    Bone scan not done yet, so ordered today to complete staging evaluation.    He has been off ADT since 2019, so I did recommend resuming it at this time. I had a long discussion with him and his daughter regarding his diagnosis and advised that while his cancer is incurable, it is controllable with hormonal therapy. He has no history of developing castrate-resistance as far as I can tell. He has tolerated Lupron well in the past, so I recommended q28mo Eligard which we have on formulary here. Casodex 50 mg/d prescribed today, which he will need to take starting 2 weeks before and until 2 weeks after his first dose of Eligard on 7/16.    Will consider addition of a secondary antiandrogen agent later depending on his tolerance to Eligard.    Will discuss referral to genetics at a later date.     2. Androgen deprivation therapy/Osteopenia. DEXA scan in May 2021 c/w osteopenia of the left femoral neck. Normal BMD in the spine. Reminded him to take Ca and vit D supplementation for bone health. Will consider adding Prolia later.     3. LUTS. Likely 2/2 overactive bladder. Followed by Drs. Wilson and Rice. Now on mirabegron and tamsulosin. S/P cystoscopy on 08/09/2019 which was unremarkable. Prostate gland not significantly enlarged.    4. Cognitive  deficits/syncopal episode. Brain MRI ordered by PCP with no acute pathology. Chronic ischemic changes, scattered old microhemorrhages and lacunar infarcts noted. His daughters are concerned for some cognitive changes they've noticed, so we agreed for them to talk to his PCP about a neurology referral.    5. Miscellaneous. He has lived in [place] but moved to the area in Sept 2020 and plans on spending half the year here and the other half in [place] where he has another daughter. He and his daughter here [name]) state that they intend on all keeping all medical appts and treatments here. They are agreeable to having him travel back  and forth for any tests and appts that are needed.     6. Follow-up. We agreed for Mr. Aboud to return on 7/16 for Eligard and again in 6-8 weeks for labs and follow-up. I encouraged the patient to call or message me via MyChart with any questions or concerns prior to their next appointment here. They had many relevant questions which I answered to the best of my ability and to their apparent satisfaction. They voiced understanding and agreed to the plan outlined above.       LOS Documentation:    Number and Complexity of Problems Addressed:  2 or more stable chronic illnesses    Amount and/or Complexity of Data Reviewed:  - Reviewed prior external notes: PCP office note and urology  - Reviewed the following results from 2021: 3+ unique labs and 3+ unique imaging tests  - Ordered the following unique test(s): 3+ unique labs and 1 unique imaging test  - Performed an assessment requiring an independent historian - daughter    Risk of Complications and/or Morbidity or Mortality of Patient Management:  Prescription drug management        I spent a total of 45 minutes reviewing the patient's chart, including relevant radiological and laboratory findings, pathology reports, and available outside records, counseling, medical decision making, coordination of care, and documentation for  today's encounter.    Disclaimer: This note was dictated with voice recognition software. Similar sounding words can inadvertently be transcribed and may not be corrected upon review.    Signed by:    Serena Croissant, MD  Medical Oncologist  [facility]  [address], [facility] 5th Floor  [place], [place] [address]  Tel: [phone]  Fax: [phone]     Ronald Lobo  7/1/202111:36 AM

## 2019-11-30 NOTE — Progress Notes (Signed)
Provider entered the tx plan.

## 2019-12-06 ENCOUNTER — Other Ambulatory Visit (INDEPENDENT_AMBULATORY_CARE_PROVIDER_SITE_OTHER): Payer: Self-pay | Admitting: Family Medicine

## 2019-12-06 DIAGNOSIS — E782 Mixed hyperlipidemia: Secondary | ICD-10-CM

## 2019-12-07 NOTE — Telephone Encounter (Signed)
Medication:  simvastatin (ZOCOR) 20 MG tablet  Last filled: 10/23/19 with a 6 month supply. ** Patient should contact Pharmacy for refills at this time**  Last Appointment: 10/23/19 for HTN, Depression,   Last Labs:  Next Appointment:     Call placed to patient to communicate message of this encounter. Patients daughter answered (listed on patients R.o.D). Verbalized understanding.

## 2019-12-14 ENCOUNTER — Ambulatory Visit: Payer: Medicare Other | Attending: Hematology & Oncology

## 2019-12-14 VITALS — BP 137/77 | HR 87 | Temp 97.1°F | Resp 18

## 2019-12-14 DIAGNOSIS — C61 Malignant neoplasm of prostate: Secondary | ICD-10-CM | POA: Insufficient documentation

## 2019-12-14 DIAGNOSIS — C772 Secondary and unspecified malignant neoplasm of intra-abdominal lymph nodes: Secondary | ICD-10-CM

## 2019-12-14 DIAGNOSIS — Z5111 Encounter for antineoplastic chemotherapy: Secondary | ICD-10-CM | POA: Insufficient documentation

## 2019-12-14 MED ORDER — ALBUTEROL SULFATE (2.5 MG/3ML) 0.083% IN NEBU
2.50 mg | INHALATION_SOLUTION | Freq: Once | RESPIRATORY_TRACT | Status: DC | PRN
Start: 2019-12-14 — End: 2019-12-14

## 2019-12-14 MED ORDER — DIPHENHYDRAMINE HCL 50 MG/ML IJ SOLN
25.00 mg | Freq: Once | INTRAMUSCULAR | Status: DC | PRN
Start: 2019-12-14 — End: 2019-12-14

## 2019-12-14 MED ORDER — DIPHENHYDRAMINE HCL 50 MG/ML IJ SOLN
50.00 mg | Freq: Once | INTRAMUSCULAR | Status: DC | PRN
Start: 2019-12-14 — End: 2019-12-14

## 2019-12-14 MED ORDER — HYDROCORTISONE SOD SUC (PF) 100 MG IJ SOLR (WRAP)
100.00 mg | Freq: Once | INTRAMUSCULAR | Status: DC | PRN
Start: 2019-12-14 — End: 2019-12-14

## 2019-12-14 MED ORDER — LEUPROLIDE ACETATE (6 MONTH) 45 MG SC KIT
45.00 mg | PACK | Freq: Once | SUBCUTANEOUS | Status: AC
Start: 2019-12-14 — End: 2019-12-14
  Administered 2019-12-14: 16:00:00 45 mg via SUBCUTANEOUS
  Filled 2019-12-14: qty 45

## 2019-12-14 MED ORDER — SODIUM CHLORIDE 0.9 % IV BOLUS
500.00 mL | Freq: Once | INTRAVENOUS | Status: DC | PRN
Start: 2019-12-14 — End: 2019-12-14

## 2019-12-14 MED ORDER — SODIUM CHLORIDE 0.9 % IV SOLN
25.00 mL/h | INTRAVENOUS | Status: DC | PRN
Start: 2019-12-14 — End: 2019-12-14

## 2019-12-14 MED ORDER — FAMOTIDINE 10 MG/ML IV SOLN (WRAP)
20.00 mg | Freq: Once | INTRAVENOUS | Status: DC | PRN
Start: 2019-12-14 — End: 2019-12-14

## 2019-12-14 MED ORDER — EPINEPHRINE HCL 1 MG/ML ADULT ANAPHYLAXIS KIT
0.30 mg | Freq: Once | INTRAMUSCULAR | Status: DC | PRN
Start: 2019-12-14 — End: 2019-12-14

## 2019-12-14 NOTE — Progress Notes (Signed)
tx is authed.

## 2019-12-14 NOTE — Progress Notes (Signed)
Tanner Lewis 79 y.o. male of Dr. Cathie Hoops arrives at the clinic today for injection due to diagnosis of prostate CA. Patient receives Eligard on L arm subQ site. Patient seems to tolerate the injection well without any complication. Band-aid applied to the site. Patient reports no complaints. Patient is accompanied by daughter and sent home in stable condition and with a schedule for the next appointment.     RTC 1/14    [name] RN, BSN, OCN, CMSRN

## 2020-01-01 ENCOUNTER — Ambulatory Visit
Admission: RE | Admit: 2020-01-01 | Discharge: 2020-01-01 | Disposition: A | Discharge status: Home / Self Care | Payer: Medicare Other | Source: Ambulatory Visit | Attending: Hematology & Oncology | Admitting: Hematology & Oncology

## 2020-01-01 ENCOUNTER — Other Ambulatory Visit: Payer: Medicare Other

## 2020-01-01 DIAGNOSIS — C772 Secondary and unspecified malignant neoplasm of intra-abdominal lymph nodes: Secondary | ICD-10-CM

## 2020-01-01 DIAGNOSIS — C61 Malignant neoplasm of prostate: Secondary | ICD-10-CM | POA: Insufficient documentation

## 2020-01-01 LAB — COMPREHENSIVE METABOLIC PANEL
ALT: 24 U/L (ref 0–55)
AST (SGOT): 27 U/L (ref 5–34)
Albumin/Globulin Ratio: 1 (ref 0.9–2.2)
Albumin: 4 g/dL (ref 3.5–5.0)
Alkaline Phosphatase: 45 U/L (ref 38–106)
Anion Gap: 10 (ref 5.0–15.0)
BUN: 18 mg/dL (ref 9.0–28.0)
Bilirubin, Total: 1.1 mg/dL (ref 0.1–1.2)
CO2: 26 mEq/L (ref 21–29)
Calcium: 10 mg/dL (ref 7.9–10.2)
Chloride: 103 mEq/L (ref 100–111)
Creatinine: 1.7 mg/dL — ABNORMAL HIGH (ref 0.5–1.5)
Globulin: 3.9 g/dL — ABNORMAL HIGH (ref 2.0–3.7)
Glucose: 107 mg/dL — ABNORMAL HIGH (ref 70–100)
Potassium: 4.3 mEq/L (ref 3.5–5.1)
Protein, Total: 7.9 g/dL (ref 6.0–8.3)
Sodium: 139 mEq/L (ref 136–145)

## 2020-01-01 LAB — CBC AND DIFFERENTIAL
Absolute NRBC: 0 10*3/uL (ref 0.00–0.00)
Basophils Absolute Automated: 0.03 10*3/uL (ref 0.00–0.08)
Basophils Automated: 0.7 %
Eosinophils Absolute Automated: 0.06 10*3/uL (ref 0.00–0.44)
Eosinophils Automated: 1.4 %
Hematocrit: 36.9 % — ABNORMAL LOW (ref 37.6–49.6)
Hgb: 12.6 g/dL (ref 12.5–17.1)
Immature Granulocytes Absolute: 0.01 10*3/uL (ref 0.00–0.07)
Immature Granulocytes: 0.2 %
Lymphocytes Absolute Automated: 1.58 10*3/uL (ref 0.42–3.22)
Lymphocytes Automated: 35.6 %
MCH: 31 pg (ref 25.1–33.5)
MCHC: 34.1 g/dL (ref 31.5–35.8)
MCV: 90.7 fL (ref 78.0–96.0)
MPV: 8.6 fL — ABNORMAL LOW (ref 8.9–12.5)
Monocytes Absolute Automated: 0.59 10*3/uL (ref 0.21–0.85)
Monocytes: 13.3 %
Neutrophils Absolute: 2.17 10*3/uL (ref 1.10–6.33)
Neutrophils: 48.8 %
Nucleated RBC: 0 /100 WBC (ref 0.0–0.0)
Platelets: 212 10*3/uL (ref 142–346)
RBC: 4.07 10*6/uL — ABNORMAL LOW (ref 4.20–5.90)
RDW: 13 % (ref 11–15)
WBC: 4.44 10*3/uL (ref 3.10–9.50)

## 2020-01-01 LAB — PSA: Prostate Specific Antigen, Total: 6.988 ng/mL — ABNORMAL HIGH (ref 0.000–4.000)

## 2020-01-01 LAB — TESTOSTERONE: Testosterone: 218 ng/dL — ABNORMAL LOW (ref 241–827)

## 2020-01-01 LAB — GFR: EGFR: 47.2

## 2020-01-01 MED ORDER — TECHNETIUM TC 99M OXIDRONATE INJECTION
27.5000 | Freq: Once | Status: AC | PRN
Start: 2020-01-01 — End: 2020-01-01
  Administered 2020-01-01: 28 via INTRAVENOUS
  Filled 2020-01-01: qty 30

## 2020-01-04 ENCOUNTER — Encounter: Payer: Self-pay | Admitting: Hematology & Oncology

## 2020-01-11 ENCOUNTER — Telehealth (INDEPENDENT_AMBULATORY_CARE_PROVIDER_SITE_OTHER): Payer: Self-pay | Admitting: MS"

## 2020-01-11 ENCOUNTER — Telehealth: Payer: Self-pay

## 2020-01-11 ENCOUNTER — Encounter: Payer: Self-pay | Admitting: Hematology & Oncology

## 2020-01-11 ENCOUNTER — Other Ambulatory Visit: Payer: Medicare Other

## 2020-01-11 ENCOUNTER — Ambulatory Visit: Payer: Medicare Other | Attending: Hematology & Oncology | Admitting: Hematology & Oncology

## 2020-01-11 VITALS — BP 118/60 | HR 80 | Temp 97.5°F | Resp 17 | Ht 67.0 in | Wt 166.4 lb

## 2020-01-11 DIAGNOSIS — R42 Dizziness and giddiness: Secondary | ICD-10-CM | POA: Insufficient documentation

## 2020-01-11 DIAGNOSIS — R399 Unspecified symptoms and signs involving the genitourinary system: Secondary | ICD-10-CM

## 2020-01-11 DIAGNOSIS — C772 Secondary and unspecified malignant neoplasm of intra-abdominal lymph nodes: Secondary | ICD-10-CM | POA: Insufficient documentation

## 2020-01-11 DIAGNOSIS — C61 Malignant neoplasm of prostate: Secondary | ICD-10-CM | POA: Insufficient documentation

## 2020-01-11 DIAGNOSIS — Z79818 Long term (current) use of other agents affecting estrogen receptors and estrogen levels: Secondary | ICD-10-CM | POA: Insufficient documentation

## 2020-01-11 NOTE — Telephone Encounter (Signed)
Appt made; pt is aware

## 2020-01-11 NOTE — Progress Notes (Signed)
[facility]Y FOLLOW-UP NOTE      Date of service: January 11, 2020    Subjective:   Chief complaint: Tanner Lewis is a 79 y.o. year old male who returns for follow-up of metastatic prostate cancer.     History of Present Illness:  Tanner Lewis presents for his first appt since resuming ADT for his recurrent prostate cancer. He completed Casodex and overlapped it with the Eligard he received on 7/16. He has tolerated ADT well, though has had dizzy spells on occasion, usually when he's trying to go to bed. He just saw an eye doctor about floaters in his eyes. He reports occasional headaches, but denies any nausea. He denies any hot flashes recently. No recent falls since his last office visit. Stable chronic urinary frequency d/t overactive bladder though he notes that his urinary stream has been a little weaker for the past couple of weeks. He denies any worsening fatigue, chest pain, shortness of breath, or bone pain. He has a sedentary lifestyle and does not exercise. He is accompanied by his daughter today.       Oncology History:     Oncology History Overview Note   Adenocarcinoma of the prostate, stage IV (TX N1 M1a)  . Original diagnosis in 2006. Initial PSA was 6.96 ng/ml.   . Prostate biopsy on 02/18/05 confirmed a prostatic adenocarcinoma, Gleason 3+3=6, associated high-grade PIN.  . S/P 125-I seed implant on 04/25/2005.  Tanner Lewis Developed biochemical recurrence, treated with ADT (Casodex/Lupron) from Dec 2011 to March 2013.   Tanner Lewis PSA 2.22 ng/ml on 09/09/2014.  Tanner Lewis Bone scan on 10/08/14 was negative for e/o bone metastases.   . CT abd/pel on 10/08/14 showed "stable bilateral adrenal nodules and multiple prominent retroperitoneal adenopathy with the largest measuring 1.6 x 1.1 cm in the right iliac chain. There was also a new enlarged lymph node along the right pelvic sidewall measuring 1.4 x 1 cm. The retroperitoneal lymph nodes appear to be stable in appearance. The max dimension of the largest lymph node is 1.6  x 0.8 cm.   . MRI prostate on 11/13/14 demonstrated a local recurrence in the left lobe of the prostate measuring 2.6 x 1.5 x 1.6 cm with deformation of the prostate capsule and likely extracapsular spread. There was a 1.5 x 1.2 cm right iliac chain lymph node, a 1.3 x 1.3 cm common iliac chain lymph node, additional smaller right iliac chain lymph nodes and right common iliac chain lymph nodes.   . Began ADT with Lupron q6 mo again on 11/25/2014.   Tanner Lewis PSA on 11/25/14 was 33.67 ng/ml; 2.18 ng/ml on 02/05/15; 1.65 in Nov 2016; 1.34 on 05/30/15.   Tanner Lewis Patient was lost to follow-up and did not receive another dose of Lupron since June 2016.  Tanner Lewis CT chest on 07/24/15 negative for e/o metastatic disease.   . Lupron was resumed on 12/21/16 and stopped again sometime in 2019.   Tanner Lewis PSA 1.62 on 06/08/17; 1.4 ng/ml on 06/20/17;   . Established care with local PCP on 05/11/2019 and a PSA was found to be 3.217 ng/ml. Referred to urology for urinary frequency.   . Renal US on 08/08/2019 was unremarkable.  . Establish care with med oncology on 08/16/2019. PSA of 10.51 ng/ml on 08/17/2019.   . CT c/a/p on 10/22/2019 showed a borderline enlarged AP window node which measures 1.4 x 1.4 cm. Indeterminate enhancing lesion in the right hepatic dome measures 1.9 x 1.7 cm. Indeterminate bilateral adrenal masses, 2.9 x  1.4 cm on right and 1.8 x 1.2 cm on left. Bilateral renal cysts noted, including a complex cyst in the upper pole right kidney which measures 4.2 x 3.0 cm, containing either internal septation or internal vessel. There is a more simple appearing cyst in the mid left kidney which measures 4.9 x 4.3 cm. Bladder wall mildly thickened. Multiple prostatic radiation seeds noted. Unremarkable seminal vesicles. There is bulky abdominal lymphadenopathy. A left periaortic lymph node measures 2.7 x 2.2 cm. Retroaortic lymph node just below the aortic bifurcation measures 2.3 x 2.0 cm. There is no pelvic lymphadenopathy. No bone lesions.   Tanner Lewis MRI abdomen  w/wo contrast on 11/13/2019 showed right liver lesion c/w hemangioma. Bilateral adrenal nodules consistent with adenomas. Renal lesions c/w cysts. Retroperitoneal adenopathy is partially imaged. For example a left periaortic lymph node measuring 3.1 x 2.6 cm.  . S/P CT-guided biopsy of retroperitoneal lymph node on 11/21/2019. Pathology consistent with a metastatic poorly-differentiated adenocarcinoma. There is comedonecrosis that can be seen in Gleason pattern 5 prostatic adenocarcinoma or colorectal adenocarcinoma. Immunostains show that the neoplastic cells are positive for NKX-3.1 and CDX-2, negative for CK7 and CK20, confirming the diagnosis of metastatic prostatic adenocarcinoma.    Tanner Lewis PSA 33.24 ng/ml on 11/29/2019.   Tanner Lewis Resume ADT with Casodex --> Eligard q56mo by 12/14/2019.   Tanner Lewis PSA down to 6.99 on 01/01/2020.   Tanner Lewis Bone scan on 01/01/2020 was negative for e/o bone metastases.      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 Hypertension    . Prostate cancer 2010       Patient Active Problem List   Diagnosis   . Essential hypertension   . History of prostate cancer   . Fatigue, unspecified type   . Keeps losing balance   . Age-related cataract of both eyes, unspecified age-related cataract type   . 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 Nocturia   . Weak urinary stream   . Rising PSA following treatment for malignant neoplasm of prostate   . Right renal mass   . Mass of both adrenal glands   . 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 amlodipine-benazepril (LOTREL) 10-20 MG per capsule Take 1 capsule by mouth daily Take one tablet by mouth once daily. 90 capsule 1   . Apoaequorin (Prevagen) 10 MG Cap Take by mouth One capsule once daily.     . hydrALAZINE (APRESOLINE) 50 MG tablet Take 1 tablet (50 mg total) by mouth 3 (three) times daily Take 1 tablet by mouth three times daily 270 tablet 1   . isosorbide dinitrate (ISORDIL) 20 MG tablet Take 1 tablet (20 mg total) by mouth 2 (two) times daily 180 tablet 1   . Mirabegron ER 50 MG Tablet SR 24 hr Take 1 tablet (50 mg total) by  mouth daily 30 tablet 2   . Misc Natural Products (PROSTATE SUPPORT PO) Take by mouth Twice daily.     Tanner Lewis oxybutynin (DITROPAN) 5 MG tablet Take 1 tablet (5 mg total) by mouth 3 (three) times daily 270 tablet 1   . simvastatin (ZOCOR) 20 MG tablet Take 1 tablet (20 mg total) by mouth nightly Take one tablet by mouth at bedtime 90 tablet 1   . tamsulosin (FLOMAX) 0.4 MG Cap Take 1 capsule (0.4 mg total) by mouth Daily after dinner Take 1 capsule by mouth daily. 90 capsule 1   . venlafaxine (EFFEXOR) 75 MG tablet Take 1 tablet (75 mg total) by mouth daily 90 tablet 1     No current facility-administered medications for this visit.       Allergies, Social History, and Family History were reviewed and updated.      Tobacco Dependence:  Social History     Tobacco Use   Smoking Status Former Smoker   . Packs/day: 1.50   . Years: 20.00   . Pack years: 30.00   . Types: Cigarettes   . Quit date: 05/10/2014   . Years since quitting: 5.6   Smokeless Tobacco Never Used       Smoking Cessation Counseling:  Date: January 11, 2020  N/A    Physical Exam:  Vitals:    01/11/20 1037   BP: 118/60   Pulse: 80   Resp: 17   Temp: 97.5 F (36.4 C)   SpO2: 98%     Body surface area is 1.89 meters squared.  ECOG performance status: 0-1  Psychosocial: Appropriate affect. Supportive daughters. Ongoing concerns regarding memory loss and  cognitive deficits.    General Appearance:  Alert, cooperative, in no distress, appears stated age  Head:  Normocephalic without obvious abnormalities, atraumatic  Eyes: Intact EOM. No scleral icterus, conjunctiva/corneas clear  Nose: Deferred, wearing mask  Throat:  Deferred, wearing mask  Neck: Supple, symmetrical, trachea midline  Lungs: Clear to auscultation bilaterally, respirations are unlabored, excursion symmetrical  Heart: Regular rate and rhythm, S1 and S2 normal, no murmurs, no tachycardia  Abdomen: Soft, non-tender, non-distended, bowel sounds normoactive, no palpable masses or organomegaly  Genitourinary: No costovertebral tenderness  Extremities: No cyanosis or edema  Skin: Normal color and turgor, no rashes  Musculoskeletal: No spinal or paraspinal tenderness  Neurologic: Cranial nerves grossly intact, no focal motor deficits, normal speech and mentation, normal gait       Labs:     Recent Results (from the past 2688 hour(s))   Whole Blood Creatinine with GFR POCT    Collection Time: 10/22/19  8:45 AM   Result Value Ref Range    Whole Blood Creatinine POCT 1.0 0.5 - 1.1 mg/dL    GFR POCT >16 >10 RU/EAV/4.09 m2   CBC and differential    Collection Time: 11/20/19  1:21 PM   Result Value Ref Range    WBC 4.93 3.1 - 9.5 x10 3/uL    Hgb 12.5 12.5 - 17.1 g/dL    Hematocrit 81.1 91.4 - 49.6 %    Platelets 220 142 - 346 x10 3/uL    RBC 4.08 (L) 4.20 - 5.90 x10 6/uL    MCV 93.9 78.0 - 96.0 fL    MCH 30.6 25.1 - 33.5 pg    MCHC 32.6 31 - 35 g/dL    RDW 14 78.2 - 95.6 %    MPV 9.6 8.9 - 12.5 fL    Neutrophils 46.4 None %  Lymphocytes Automated 39.8 None %    Monocytes 11.8 None %    Eosinophils Automated 1.4 None %    Basophils Automated 0.4 None %    Immature Granulocytes 0.2 None %    Nucleated RBC 0.0 0.0 - 0.0 /100 WBC    Neutrophils Absolute 2.29 1 - 6 x10 3/uL    Lymphocytes Absolute Automated 1.96 0.42 - 3.22 x10 3/uL    Monocytes Absolute Automated 0.58 0.21 - 0.85 x10 3/uL    Eosinophils Absolute  Automated 0.07 0.00 - 0.44 x10 3/uL    Basophils Absolute Automated 0.02 0.00 - 0.08 x10 3/uL    Immature Granulocytes Absolute 0.01 0.00 - 0.07 x10 3/uL    Absolute NRBC 0.00 0.00 - 0.00 x10 3/uL   Comprehensive metabolic panel    Collection Time: 11/29/19 12:06 PM   Result Value Ref Range    Glucose 107 (H) 70 - 100 mg/dL    BUN 16.1 9 - 28 mg/dL    Creatinine 1.5 0.5 - 1.5 mg/dL    Sodium 096 045 - 409 mEq/L    Potassium 3.9 3.5 - 5.1 mEq/L    Chloride 107 100 - 111 mEq/L    CO2 25 21 - 29 mEq/L    Calcium 9.1 7.9 - 10.2 mg/dL    Protein, Total 8.0 6.0 - 8.3 g/dL    Albumin 4.0 3.5 - 5.0 g/dL    AST (SGOT) 26 5 - 34 U/L    ALT 22 0 - 55 U/L    Alkaline Phosphatase 45 38 - 106 U/L    Bilirubin, Total 1.0 0.1 - 1.2 mg/dL    Globulin 4.0 (H) 2.0 - 3.7 g/dL    Albumin/Globulin Ratio 1.0 0.9 - 2.2    Anion Gap 10.0 5 - 15   PSA    Collection Time: 11/29/19 12:06 PM   Result Value Ref Range    Prostate Specific Antigen, Total 33.241 (H) 0.000 - 4.000 ng/mL   GFR    Collection Time: 11/29/19 12:06 PM   Result Value Ref Range    EGFR 54.6    CBC and differential    Collection Time: 01/01/20  9:41 AM   Result Value Ref Range    WBC 4.44 3.1 - 9.5 x10 3/uL    Hgb 12.6 12.5 - 17.1 g/dL    Hematocrit 81.1 (L) 37.6 - 49.6 %    Platelets 212 142 - 346 x10 3/uL    RBC 4.07 (L) 4.20 - 5.90 x10 6/uL    MCV 90.7 78.0 - 96.0 fL    MCH 31.0 25.1 - 33.5 pg    MCHC 34.1 31 - 35 g/dL    RDW 13 91.4 - 78.2 %    MPV 8.6 (L) 8.9 - 12.5 fL    Neutrophils 48.8 None %    Lymphocytes Automated 35.6 None %    Monocytes 13.3 None %    Eosinophils Automated 1.4 None %    Basophils Automated 0.7 None %    Immature Granulocytes 0.2 None %    Nucleated RBC 0.0 0.0 - 0.0 /100 WBC    Neutrophils Absolute 2.17 1 - 6 x10 3/uL    Lymphocytes Absolute Automated 1.58 0.42 - 3.22 x10 3/uL    Monocytes Absolute Automated 0.59 0.21 - 0.85 x10 3/uL    Eosinophils Absolute Automated 0.06 0.00 - 0.44 x10 3/uL    Basophils Absolute Automated 0.03 0.00 - 0.08 x10  3/uL    Immature Granulocytes Absolute  0.01 0.00 - 0.07 x10 3/uL    Absolute NRBC 0.00 0.00 - 0.00 x10 3/uL   Comprehensive metabolic panel    Collection Time: 01/01/20  9:41 AM   Result Value Ref Range    Glucose 107 (H) 70 - 100 mg/dL    BUN 16.1 9 - 28 mg/dL    Creatinine 1.7 (H) 0.5 - 1.5 mg/dL    Sodium 096 045 - 409 mEq/L    Potassium 4.3 3.5 - 5.1 mEq/L    Chloride 103 100 - 111 mEq/L    CO2 26 21 - 29 mEq/L    Calcium 10.0 7.9 - 10.2 mg/dL    Protein, Total 7.9 6.0 - 8.3 g/dL    Albumin 4.0 3.5 - 5.0 g/dL    AST (SGOT) 27 5 - 34 U/L    ALT 24 0 - 55 U/L    Alkaline Phosphatase 45 38 - 106 U/L    Bilirubin, Total 1.1 0.1 - 1.2 mg/dL    Globulin 3.9 (H) 2.0 - 3.7 g/dL    Albumin/Globulin Ratio 1.0 0.9 - 2.2    Anion Gap 10.0 5 - 15   PSA    Collection Time: 01/01/20  9:41 AM   Result Value Ref Range    Prostate Specific Antigen, Total 6.988 (H) 0.000 - 4.000 ng/mL   Testosterone    Collection Time: 01/01/20  9:41 AM   Result Value Ref Range    Testosterone 218 (L) 241 - 827 ng/dL   GFR    Collection Time: 01/01/20  9:41 AM   Result Value Ref Range    EGFR 47.2    .    Rads:   NM Bone/Joint Scan Whole Body    Result Date: 01/01/2020  1. No scintigraphic evidence for osseous metastatic disease. 2. Degenerative uptake at locations indicated above. Marty Heck, MD  01/01/2020 2:19 PM    I personally reviewed his bone scan and agree with radiologist's assessment.      Assessment:     1. 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 Androgen deprivation therapy    3. Lower urinary tract symptoms (LUTS)    4. Vertigo       Recommendations:     1. Adenocarcinoma of the prostate, stage IV (TX N1 M1a). Original diagnosis of localized PCa in 2006 S/P brachytherapy. Developed biochemical recurrence in 2011 and was treated with ADT from Dec 2011 to March 2013.  CT and bone scans on 10/08/14 notable for retroperitoneal adenopathy with the largest measuring 1.6 x 1.1 cm in the right iliac chain. There was also a  new enlarged lymph node along the right pelvic sidewall measuring 1.4 x 1 cm.  MRI prostate on 11/13/14 consistent with local recurrence in the left lobe of the prostate measuring 2.6 x 1.5 x 1.6 cm with deformation of the prostate capsule and likely extracapsular spread. There was a 1.5 x 1.2 cm right iliac chain lymph node, a 1.3 x 1.3 cm common iliac chain lymph node, additional smaller right iliac chain lymph nodes and right common iliac chain lymph nodes.  Lupron q6 mo was resumed on 11/25/2014 after which was then lost to follow-up until he received another dose of Lupron in July 2018. By the time I met him in March 2021 for a rising PSA level, he had been off ADT for at least 2 years.PSA has recently trended as follows: 1.62 on 06/08/17; 1.4 ng/ml on 06/20/17; 3.22 on 05/11/2019; and up  to 10.5 on 08/17/2019. CT c/a/p and MRI abdomen done in May/June 2021 was notable for retroperitoneal adenopathy. CT-guided retroperitoneal LN biopsy on 11/21/2019 confirmed diagnosis of metastatic prostate cancer. Bone scan on 01/01/2020 was negative for e/o bone metastases. He was started back on ADT with Casodex at the beginning of July and received q29mo Eligard on 12/14/2019. Tolerating it well so far and his PSA has decreased from 33 to 6.99 n/gml by 01/01/2020, consistent with response to ADT. For now, I would like to continue Eligard alone as I am concerned about his cognitive issues and history of syncope, falls, and recent vertigo which could be exacerbated by adding a secondary antiandrogen agent. We agreed on a restaging CT a/p in about 6 weeks after he returns from a trip to [place]. He agreed to the plan outlined above. Discussed a genetics referral which he is agreeable to.     2. Androgen deprivation therapy/Osteopenia. DEXA scan in May 2021 c/w osteopenia of the left femoral neck. Normal BMD in the spine. Continue Ca and vit D supplementation for bone health. Will consider adding Prolia later.     3. LUTS. Likely 2/2  overactive bladder. Followed by Drs. Wilson and Rice. Now on mirabegron and tamsulosin. S/P cystoscopy on 08/09/2019 which was unremarkable. Prostate gland not significantly enlarged.    4. Cognitive deficits/syncopal episode. Brain MRI ordered by PCP with no acute pathology. Chronic ischemic changes, scattered old microhemorrhages and lacunar infarcts noted. His daughters are concerned for some cognitive changes they've noticed. He's had vertigo recently for unclear reasons. Neurology referral pending through PCP.     5. Miscellaneous. He has lived in [place] but moved to the area in Sept 2020 and plans on spending half the year here and the other half in [place] where he has another daughter. He and his daughter here [name]) state that they intend on all keeping all medical appts and treatments here. They are agreeable to having him travel back and forth for any tests and appts that are needed.     6. Follow-up. We agreed for Mr. Forst to return in 6 weeks for labs and office visit.  I encouraged the patient to call or message me via MyChart with any questions or concerns prior to their next appointment here. They had many relevant questions which I answered to the best of my ability and to their apparent satisfaction. They voiced understanding and agreed to the plan outlined above.       LOS Documentation:    Number and Complexity of Problems Addressed:  2 or more stable chronic illnesses    Amount and/or Complexity of Data Reviewed:  - Reviewed the following results from 2021: 3+ unique labs and 1 unique imaging test  - Ordered the following unique test(s): 3+ unique labs and 1 unique imaging test  - Performed an assessment requiring an independent historian - daughter    Risk of Complications and/or Morbidity or Mortality of Patient Management:  Prescription drug management        I spent a total of 40 minutes reviewing the patient's chart, including relevant radiological and laboratory findings, pathology  reports, and available outside records, counseling, medical decision making, coordination of care, and documentation for today's encounter.    Disclaimer: This note was dictated with voice recognition software. Similar sounding words can inadvertently be transcribed and may not be corrected upon review.    Signed by:    Serena Croissant, MD  Medical Oncologist  [facility]  [address] [facility] [address]  [place], [place] [address]  Tel: [phone]  Fax: [phone]     Ronald Lobo  8/13/202111:10 AM

## 2020-01-11 NOTE — Telephone Encounter (Signed)
Reviewed with Dr. Cathie Hoops. Please assist patient with scheduling a CT abdomen/pelvis in six weeks. Please assist patient with scheduling a lab appointment and an office visit with Dr. Cathie Hoops in seven weeks after the CT scans. Orders in Epic.

## 2020-01-11 NOTE — Telephone Encounter (Signed)
Tanner Lewis was referred to genetic counseling by Dr. Cathie Hoops on 01/11/2020 for a diagnosis of metastatic prostate cancer.    I talked to [name]'s daughter, [name], about the referral and she wanted to think about it before scheduling.    [name] will either call me back or she asked me to call back on Monday.    Minus Liberty  Clinical Assistant   Ph: [phone]  Fax: [phone]

## 2020-01-14 ENCOUNTER — Telehealth (INDEPENDENT_AMBULATORY_CARE_PROVIDER_SITE_OTHER): Payer: Self-pay | Admitting: MS"

## 2020-01-14 NOTE — Telephone Encounter (Signed)
Tanner Lewis was referred to genetic counseling by Dr. Cathie Hoops on 01/11/2020 for a diagnosis of metastatic prostate cancer.    I left a voicemail with Breland Doleman's daughter asking them to call us back so that we can begin the scheduling process.     I also sent them another email informing them about the referral.    Minus Liberty  Clinical Assistant   Ph: [phone]  Fax: [phone]

## 2020-01-29 ENCOUNTER — Encounter (INDEPENDENT_AMBULATORY_CARE_PROVIDER_SITE_OTHER): Payer: Self-pay | Admitting: Family Medicine

## 2020-02-06 ENCOUNTER — Encounter (INDEPENDENT_AMBULATORY_CARE_PROVIDER_SITE_OTHER): Payer: Self-pay | Admitting: Family Medicine

## 2020-02-11 ENCOUNTER — Telehealth (INDEPENDENT_AMBULATORY_CARE_PROVIDER_SITE_OTHER): Payer: Medicare Other | Admitting: Family Medicine

## 2020-02-11 ENCOUNTER — Encounter (INDEPENDENT_AMBULATORY_CARE_PROVIDER_SITE_OTHER): Payer: Self-pay | Admitting: Family Medicine

## 2020-02-11 VITALS — Ht 67.0 in | Wt 170.0 lb

## 2020-02-11 DIAGNOSIS — R42 Dizziness and giddiness: Secondary | ICD-10-CM

## 2020-02-11 MED ORDER — AMLODIPINE BESY-BENAZEPRIL HCL 5-10 MG PO CAPS
1.0000 | ORAL_CAPSULE | Freq: Every day | ORAL | 1 refills | Status: DC
Start: 2020-02-11 — End: 2020-08-19

## 2020-02-11 NOTE — Progress Notes (Signed)
Subjective:      Patient ID: Tanner Lewis  is a 79 y.o.  male.     Chief Complaint   Patient presents with   . Dizziness     referral request         Verbal consent has been obtained from the patient to conduct a Video visit encounter to minimize exposure to COVID-19: yes    Telemedicine Documentation Requirements    Provider and Title: Elray Buba. D.O  Consent obtained: YES/NO: Yes  Language, if applicable and if translator was required: English      HPI    3 weeks of dizziness mainly at night when he lays downs. Has not noticed anything making it better or worse.     No Nausea, no vomiting, no HA, no weakness, numberness or tingling.     One has one fall after trying to get up when he was dizziness.     No changes in hearing    No changes in vision    No head trauma      The following portions of the patient's history were reviewed and updated as appropriate: current medications, allergies, past family history, past medical history, past social history, past surgical history and problem list.     Past Medical History:   Diagnosis Date   . Hypertension    . Prostate cancer 2010       Patient Active Problem List   Diagnosis   . Essential hypertension   . History of prostate cancer   . Fatigue, unspecified type   . Keeps losing balance   . Age-related cataract of both eyes, unspecified age-related cataract type   . 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 Nocturia   . Weak urinary stream   . Rising PSA following treatment for malignant neoplasm of prostate   . Right renal mass   . Mass of both adrenal glands   . 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 Solarcaine [Benzocaine] Rash     Happened over 5 years ago.         Current Outpatient Medications:   .  amlodipine-benazepril (LOTREL) 10-20 MG per capsule, Take 1 capsule by mouth daily Take one tablet by mouth once daily.,  Disp: 90 capsule, Rfl: 1  .  Apoaequorin (Prevagen) 10 MG Cap, Take by mouth One capsule once daily., Disp: , Rfl:   .  hydrALAZINE (APRESOLINE) 50 MG tablet, Take 1 tablet (50 mg total) by mouth 3 (three) times daily Take 1 tablet by mouth three times daily, Disp: 270 tablet, Rfl: 1  .  isosorbide dinitrate (ISORDIL) 20 MG tablet, Take 1 tablet (20 mg total) by mouth 2 (two) times daily, Disp: 180 tablet, Rfl: 1  .  Misc Natural Products (PROSTATE SUPPORT PO), Take by mouth Twice daily., Disp: , Rfl:   .  simvastatin (ZOCOR) 20 MG tablet, Take 1 tablet (20 mg total) by mouth nightly Take one tablet by mouth at bedtime, Disp: 90 tablet, Rfl: 1  .  tamsulosin (FLOMAX) 0.4 MG Cap, Take 1 capsule (0.4 mg total) by mouth Daily after dinner Take 1 capsule by mouth daily., Disp: 90 capsule, Rfl: 1  .  venlafaxine (EFFEXOR) 75 MG tablet, Take 1 tablet (75 mg total) by mouth daily, Disp: 90 tablet, Rfl: 1    Review  of Systems          Objective:   No Vitals, Video Visit  No LMP for male patient.    Physical Exam       Assessment and Plan:   Time spent in medical discussion: 11 to 20 minutes    History collected from patient's daughter and patient    1. Dizziness  2. Orthostatic dizziness  - amLODIPine-benazepril (LOTREL 5-10) 5-10 MG per capsule; Take 1 capsule by mouth daily  Dispense: 90 capsule; Refill: 1  - Ambulatory referral to Physical Therapy  - Presented with complaint of dizziness when laying down and after getting up. Concern for orthostatic hypotension. Concern on proper hydration, slow transition. Decrease blood pressure to half of current does  - PT referral to rule out BPPV         Risk & Benefits of any previous or new medication(s) were explained to the patient who verbalized understanding & agreed to the treatment plan.     Call with updates/questions/concerns.

## 2020-02-11 NOTE — Progress Notes (Signed)
Have you seen any specialists/other providers since your last visit with us?    No    Arm preference verified?   No    The patient is due for spirometry, influenza vaccine and shingles vaccine

## 2020-02-21 ENCOUNTER — Ambulatory Visit
Admission: RE | Admit: 2020-02-21 | Discharge: 2020-02-21 | Disposition: A | Discharge status: Home / Self Care | Payer: Medicare Other | Source: Ambulatory Visit | Attending: Hematology & Oncology | Admitting: Hematology & Oncology

## 2020-02-21 DIAGNOSIS — C61 Malignant neoplasm of prostate: Secondary | ICD-10-CM | POA: Insufficient documentation

## 2020-02-21 DIAGNOSIS — C772 Secondary and unspecified malignant neoplasm of intra-abdominal lymph nodes: Secondary | ICD-10-CM

## 2020-02-21 DIAGNOSIS — K769 Liver disease, unspecified: Secondary | ICD-10-CM | POA: Insufficient documentation

## 2020-02-21 DIAGNOSIS — IMO0001 Reserved for inherently not codable concepts without codable children: Secondary | ICD-10-CM

## 2020-02-21 DIAGNOSIS — Z79818 Long term (current) use of other agents affecting estrogen receptors and estrogen levels: Secondary | ICD-10-CM

## 2020-02-21 DIAGNOSIS — E279 Disorder of adrenal gland, unspecified: Secondary | ICD-10-CM | POA: Insufficient documentation

## 2020-02-21 LAB — WHOLE BLOOD CREATININE WITH GFR POCT
GFR POCT: 57 mL/min/{1.73_m2} — ABNORMAL LOW (ref >60)
Whole Blood Creatinine POCT: 1.5 mg/dL — ABNORMAL HIGH (ref 0.5–1.1)

## 2020-02-21 MED ORDER — IOHEXOL 350 MG/ML IV SOLN
100.0000 mL | Freq: Once | INTRAVENOUS | Status: AC | PRN
Start: 2020-02-21 — End: 2020-02-21
  Administered 2020-02-21: 09:00:00 100 mL via INTRAVENOUS

## 2020-02-21 MED ORDER — IOHEXOL 12 MG/ML PO SOLN
1000.0000 mL | Freq: Once | ORAL | Status: AC | PRN
Start: 2020-02-21 — End: 2020-02-21
  Administered 2020-02-21: 09:00:00 1000 mL via ORAL

## 2020-02-22 ENCOUNTER — Ambulatory Visit: Payer: Medicare Other

## 2020-02-29 ENCOUNTER — Encounter: Payer: Self-pay | Admitting: Hematology & Oncology

## 2020-02-29 ENCOUNTER — Ambulatory Visit: Payer: Medicare Other | Attending: Hematology & Oncology | Admitting: Hematology & Oncology

## 2020-02-29 ENCOUNTER — Other Ambulatory Visit (FREE_STANDING_LABORATORY_FACILITY): Payer: Medicare Other

## 2020-02-29 VITALS — BP 128/76 | HR 91 | Temp 97.7°F | Resp 18 | Ht 67.0 in | Wt 167.2 lb

## 2020-02-29 DIAGNOSIS — Z79818 Long term (current) use of other agents affecting estrogen receptors and estrogen levels: Secondary | ICD-10-CM | POA: Insufficient documentation

## 2020-02-29 DIAGNOSIS — R399 Unspecified symptoms and signs involving the genitourinary system: Secondary | ICD-10-CM | POA: Insufficient documentation

## 2020-02-29 DIAGNOSIS — E278 Other specified disorders of adrenal gland: Secondary | ICD-10-CM | POA: Insufficient documentation

## 2020-02-29 DIAGNOSIS — R4189 Other symptoms and signs involving cognitive functions and awareness: Secondary | ICD-10-CM | POA: Insufficient documentation

## 2020-02-29 DIAGNOSIS — C772 Secondary and unspecified malignant neoplasm of intra-abdominal lymph nodes: Secondary | ICD-10-CM

## 2020-02-29 DIAGNOSIS — C61 Malignant neoplasm of prostate: Secondary | ICD-10-CM

## 2020-02-29 DIAGNOSIS — R160 Hepatomegaly, not elsewhere classified: Secondary | ICD-10-CM | POA: Insufficient documentation

## 2020-02-29 LAB — COMPREHENSIVE METABOLIC PANEL
ALT: 33 U/L (ref 0–55)
AST (SGOT): 32 U/L (ref 5–34)
Albumin/Globulin Ratio: 1.1 (ref 0.9–2.2)
Albumin: 4 g/dL (ref 3.5–5.0)
Alkaline Phosphatase: 48 U/L (ref 38–106)
Anion Gap: 9 (ref 5.0–15.0)
BUN: 26 mg/dL (ref 9.0–28.0)
Bilirubin, Total: 0.6 mg/dL (ref 0.1–1.2)
CO2: 27 mEq/L (ref 21–29)
Calcium: 9.8 mg/dL (ref 7.9–10.2)
Chloride: 102 mEq/L (ref 100–111)
Creatinine: 2.1 mg/dL — ABNORMAL HIGH (ref 0.5–1.5)
Globulin: 3.8 g/dL — ABNORMAL HIGH (ref 2.0–3.7)
Glucose: 116 mg/dL — ABNORMAL HIGH (ref 70–100)
Potassium: 4.6 mEq/L (ref 3.5–5.1)
Protein, Total: 7.8 g/dL (ref 6.0–8.3)
Sodium: 138 mEq/L (ref 136–145)

## 2020-02-29 LAB — CBC AND DIFFERENTIAL
Absolute NRBC: 0 10*3/uL (ref 0.00–0.00)
Basophils Absolute Automated: 0.02 10*3/uL (ref 0.00–0.08)
Basophils Automated: 0.4 %
Eosinophils Absolute Automated: 0.07 10*3/uL (ref 0.00–0.44)
Eosinophils Automated: 1.3 %
Hematocrit: 36.2 % — ABNORMAL LOW (ref 37.6–49.6)
Hgb: 12.3 g/dL — ABNORMAL LOW (ref 12.5–17.1)
Immature Granulocytes Absolute: 0.02 10*3/uL (ref 0.00–0.07)
Immature Granulocytes: 0.4 %
Lymphocytes Absolute Automated: 2.3 10*3/uL (ref 0.42–3.22)
Lymphocytes Automated: 43 %
MCH: 30.8 pg (ref 25.1–33.5)
MCHC: 34 g/dL (ref 31.5–35.8)
MCV: 90.7 fL (ref 78.0–96.0)
MPV: 8.7 fL — ABNORMAL LOW (ref 8.9–12.5)
Monocytes Absolute Automated: 0.57 10*3/uL (ref 0.21–0.85)
Monocytes: 10.7 %
Neutrophils Absolute: 2.37 10*3/uL (ref 1.10–6.33)
Neutrophils: 44.2 %
Nucleated RBC: 0 /100 WBC (ref 0.0–0.0)
Platelets: 213 10*3/uL (ref 142–346)
RBC: 3.99 10*6/uL — ABNORMAL LOW (ref 4.20–5.90)
RDW: 13 % (ref 11–15)
WBC: 5.35 10*3/uL (ref 3.10–9.50)

## 2020-02-29 LAB — PSA: Prostate Specific Antigen, Total: 3.186 ng/mL (ref 0.000–4.000)

## 2020-02-29 LAB — GFR: EGFR: 37

## 2020-02-29 LAB — TESTOSTERONE: Testosterone: 12 ng/dL — ABNORMAL LOW (ref 241–827)

## 2020-02-29 NOTE — Progress Notes (Signed)
[facility] ON[facility]Y FOLLOW-UP NOTE      Date of service: February 29, 2020    Subjective:   Chief complaint: Tanner Lewis is a 79 y.o. year old male who returns for follow-up of metastatic prostate cancer.     History of Present Illness:  Tanner Lewis presents for routine follow-up. He remains on Eligard, next due on 06/14/2019. He denies any new complaints. His dizzy spells are improved since his PCP adjusted his BP medication. No syncopal episodes or falls since his last office visit. His nocturia remains significant, but he is otherwise stable. He denies any worsening fatigue, chest pain, shortness of breath, or bone pain. Admits to mild hot flashes.  He is accompanied by his daughter today.       Oncology History:     Oncology History Overview Note   Adenocarcinoma of the prostate, stage IV (TX N1 M1a)  . Original diagnosis in 2006. Initial PSA was 6.96 ng/ml.   . Prostate biopsy on 02/18/05 confirmed a prostatic adenocarcinoma, Gleason 3+3=6, associated high-grade PIN.  . S/P 125-I seed implant on 04/25/2005.  [name] Developed biochemical recurrence, treated with ADT (Casodex/Lupron) from Dec 2011 to March 2013.   [name] PSA 2.22 ng/ml on 09/09/2014.  [name] Bone scan on 10/08/14 was negative for e/o bone metastases.   . CT abd/pel on 10/08/14 showed "stable bilateral adrenal nodules and multiple prominent retroperitoneal adenopathy with the largest measuring 1.6 x 1.1 cm in the right iliac chain. There was also a new enlarged lymph node along the right pelvic sidewall measuring 1.4 x 1 cm. The retroperitoneal lymph nodes appear to be stable in appearance. The max dimension of the largest lymph node is 1.6 x 0.8 cm.   . MRI prostate on 11/13/14 demonstrated a local recurrence in the left lobe of the prostate measuring 2.6 x 1.5 x 1.6 cm with deformation of the prostate capsule and likely extracapsular spread. There was a 1.5 x 1.2 cm right iliac chain lymph node, a 1.3 x 1.3 cm common iliac chain lymph node, additional smaller  right iliac chain lymph nodes and right common iliac chain lymph nodes.   . Began ADT with Lupron q6 mo again on 11/25/2014.   [name] PSA on 11/25/14 was 33.67 ng/ml; 2.18 ng/ml on 02/05/15; 1.65 in Nov 2016; 1.34 on 05/30/15.   [name] Patient was lost to follow-up and did not receive another dose of Lupron since June 2016.  [name] CT chest on 07/24/15 negative for e/o metastatic disease.   . Lupron was resumed on 12/21/16 and stopped again sometime in 2019.   [name] PSA 1.62 on 06/08/17; 1.4 ng/ml on 06/20/17;   . Established care with local PCP on 05/11/2019 and a PSA was found to be 3.217 ng/ml. Referred to urology for urinary frequency.   . Renal US on 08/08/2019 was unremarkable.  . Establish care with med oncology on 08/16/2019. PSA of 10.51 ng/ml on 08/17/2019.   . CT c/a/p on 10/22/2019 showed a borderline enlarged AP window node which measures 1.4 x 1.4 cm. Indeterminate enhancing lesion in the right hepatic dome measures 1.9 x 1.7 cm. Indeterminate bilateral adrenal masses, 2.9 x 1.4 cm on right and 1.8 x 1.2 cm on left. Bilateral renal cysts noted, including a complex cyst in the upper pole right kidney which measures 4.2 x 3.0 cm, containing either internal septation or internal vessel. There is a more simple appearing cyst in the mid left kidney which measures 4.9 x 4.3 cm. Bladder wall mildly  thickened. Multiple prostatic radiation seeds noted. Unremarkable seminal vesicles. There is bulky abdominal lymphadenopathy. A left periaortic lymph node measures 2.7 x 2.2 cm. Retroaortic lymph node just below the aortic bifurcation measures 2.3 x 2.0 cm. There is no pelvic lymphadenopathy. No bone lesions.   [name] MRI abdomen w/wo contrast on 11/13/2019 showed right liver lesion c/w hemangioma. Bilateral adrenal nodules consistent with adenomas. Renal lesions c/w cysts. Retroperitoneal adenopathy is partially imaged. For example a left periaortic lymph node measuring 3.1 x 2.6 cm.  . S/P CT-guided biopsy of retroperitoneal lymph node on 11/21/2019.  Pathology consistent with a metastatic poorly-differentiated adenocarcinoma. There is comedonecrosis that can be seen in Gleason pattern 5 prostatic adenocarcinoma or colorectal adenocarcinoma. Immunostains show that the neoplastic cells are positive for NKX-3.1 and CDX-2, negative for CK7 and CK20, confirming the diagnosis of metastatic prostatic adenocarcinoma.    [name] PSA 33.24 ng/ml on 11/29/2019.   [name] Resume ADT with Casodex --> Eligard q50mo by 12/14/2019.   [name] PSA down to 6.99 on 01/01/2020.   [name] Bone scan on 01/01/2020 was negative for e/o bone metastases.   . CT abdomen/pelvis on 02/21/2020. Hepatic dome mass: 2.3 x 2.1 cm (302:16), previously 1.9 x 1.7 cm. Right adrenal mass: 3.1 x 1.6 cm (302:46), previously 2.9 x 1.3 cm. Left adrenal mass: 2.0 x 1.3 cm (302:51), previously 1.8 x 1.2 cm. Left periaortic lymph node: 1.8 x 1.3 cm (302:70), previously 2.7 x 2.2 cm. Aortic bifurcation lymph node: 1.7 x 1.0 cm (302:86), previously 2.3 x 2.0 cm. Enhancing mass in hepatic dome appears slightly increased in size. Bilateral adrenal mass are seen also minimally increased in size from prior exam. Multiple complex cysts are seen in the both kidneys. Retroperitoneal lymph nodes are decreased in size.     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 Hypertension    . Prostate cancer 2010       Patient Active Problem List   Diagnosis   . Essential hypertension   . History of prostate cancer   . Fatigue, unspecified type   . Keeps losing balance   . Age-related cataract of both eyes, unspecified age-related cataract type   . 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,  bilateral   . 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 Nocturia   . Weak urinary stream   . Rising PSA following treatment for malignant neoplasm of prostate   . Right renal mass   . Mass of both adrenal glands   . 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 amLODIPine-benazepril (LOTREL 5-10) 5-10 MG per capsule Take 1 capsule by mouth daily 90 capsule 1   . hydrALAZINE (APRESOLINE) 50 MG tablet Take 1 tablet (50 mg total) by mouth 3 (three) times daily Take 1 tablet by mouth three times daily 270 tablet 1   . isosorbide dinitrate (ISORDIL) 20 MG tablet Take 1 tablet (  20 mg total) by mouth 2 (two) times daily 180 tablet 1   . Misc Natural Products (PROSTATE SUPPORT PO) Take by mouth Twice daily.     . simvastatin (ZOCOR) 20 MG tablet Take 1 tablet (20 mg total) by mouth nightly Take one tablet by mouth at bedtime 90 tablet 1   . tamsulosin (FLOMAX) 0.4 MG Cap Take 1 capsule (0.4 mg total) by mouth Daily after dinner Take 1 capsule by mouth daily. 90 capsule 1   . venlafaxine (EFFEXOR) 75 MG tablet Take 1 tablet (75 mg total) by mouth daily 90 tablet 1   . Apoaequorin (Prevagen) 10 MG Cap Take by mouth One capsule once daily.       No current facility-administered medications for this visit.       Allergies, Social History, and Family History were reviewed and updated.      Tobacco Dependence:  Social History     Tobacco Use   Smoking Status Former Smoker   . Packs/day: 1.50   . Years: 20.00   . Pack years: 30.00   . Types: Cigarettes   . Quit date: 05/10/2014   . Years since quitting: 5.8   Smokeless Tobacco Never Used       Smoking Cessation Counseling:  Date: February 29, 2020  N/A    Physical Exam:  Vitals:    02/29/20 1048   BP: 128/76   Pulse: 91   Resp: 18   Temp: 97.7 F (36.5 C)   SpO2: 98%     Body surface area is 1.89 meters squared.  ECOG performance status: 0-1  Psychosocial:  Appropriate affect. Supportive daughters. Ongoing concerns regarding memory loss and cognitive deficits.    General Appearance:  Alert, cooperative, in no distress, appears stated age  Head:  Normocephalic without obvious abnormalities, atraumatic  Eyes: Intact EOM. No scleral icterus, conjunctiva/corneas clear  Nose: Deferred, wearing mask  Throat:  Deferred, wearing mask  Neck: Supple, symmetrical, trachea midline  Lungs: Clear to auscultation bilaterally, respirations are unlabored, excursion symmetrical  Heart: Regular rate and rhythm, S1 and S2 normal, no murmurs, no tachycardia  Abdomen: Soft, non-tender, non-distended, bowel sounds normoactive  Genitourinary: No costovertebral tenderness  Extremities: No cyanosis or edema  Skin: Normal color and turgor, no rashes  Musculoskeletal: No spinal or paraspinal tenderness  Neurologic: Cranial nerves grossly intact, no focal motor deficits, normal speech and mentation      Labs:     Recent Results (from the past 2688 hour(s))   CBC and differential    Collection Time: 11/20/19  1:21 PM   Result Value Ref Range    WBC 4.93 3.1 - 9.5 x10 3/uL    Hgb 12.5 12.5 - 17.1 g/dL    Hematocrit 16.1 09.6 - 49.6 %    Platelets 220 142 - 346 x10 3/uL    RBC 4.08 (L) 4.20 - 5.90 x10 6/uL    MCV 93.9 78.0 - 96.0 fL    MCH 30.6 25.1 - 33.5 pg    MCHC 32.6 31 - 35 g/dL    RDW 14 04.5 - 40.9 %    MPV 9.6 8.9 - 12.5 fL    Neutrophils 46.4 None %    Lymphocytes Automated 39.8 None %    Monocytes 11.8 None %    Eosinophils Automated 1.4 None %    Basophils Automated 0.4 None %    Immature Granulocytes 0.2 None %    Nucleated RBC 0.0 0.0 - 0.0 /100 WBC  Neutrophils Absolute 2.29 1 - 6 x10 3/uL    Lymphocytes Absolute Automated 1.96 0.42 - 3.22 x10 3/uL    Monocytes Absolute Automated 0.58 0.21 - 0.85 x10 3/uL    Eosinophils Absolute Automated 0.07 0.00 - 0.44 x10 3/uL    Basophils Absolute Automated 0.02 0.00 - 0.08 x10 3/uL    Immature Granulocytes Absolute 0.01 0.00 - 0.07 x10 3/uL     Absolute NRBC 0.00 0.00 - 0.00 x10 3/uL   Comprehensive metabolic panel    Collection Time: 11/29/19 12:06 PM   Result Value Ref Range    Glucose 107 (H) 70 - 100 mg/dL    BUN 16.1 9 - 28 mg/dL    Creatinine 1.5 0.5 - 1.5 mg/dL    Sodium 096 045 - 409 mEq/L    Potassium 3.9 3.5 - 5.1 mEq/L    Chloride 107 100 - 111 mEq/L    CO2 25 21 - 29 mEq/L    Calcium 9.1 7.9 - 10.2 mg/dL    Protein, Total 8.0 6.0 - 8.3 g/dL    Albumin 4.0 3.5 - 5.0 g/dL    AST (SGOT) 26 5 - 34 U/L    ALT 22 0 - 55 U/L    Alkaline Phosphatase 45 38 - 106 U/L    Bilirubin, Total 1.0 0.1 - 1.2 mg/dL    Globulin 4.0 (H) 2.0 - 3.7 g/dL    Albumin/Globulin Ratio 1.0 0.9 - 2.2    Anion Gap 10.0 5 - 15   PSA    Collection Time: 11/29/19 12:06 PM   Result Value Ref Range    Prostate Specific Antigen, Total 33.241 (H) 0.000 - 4.000 ng/mL   GFR    Collection Time: 11/29/19 12:06 PM   Result Value Ref Range    EGFR 54.6    CBC and differential    Collection Time: 01/01/20  9:41 AM   Result Value Ref Range    WBC 4.44 3.1 - 9.5 x10 3/uL    Hgb 12.6 12.5 - 17.1 g/dL    Hematocrit 81.1 (L) 37.6 - 49.6 %    Platelets 212 142 - 346 x10 3/uL    RBC 4.07 (L) 4.20 - 5.90 x10 6/uL    MCV 90.7 78.0 - 96.0 fL    MCH 31.0 25.1 - 33.5 pg    MCHC 34.1 31 - 35 g/dL    RDW 13 91.4 - 78.2 %    MPV 8.6 (L) 8.9 - 12.5 fL    Neutrophils 48.8 None %    Lymphocytes Automated 35.6 None %    Monocytes 13.3 None %    Eosinophils Automated 1.4 None %    Basophils Automated 0.7 None %    Immature Granulocytes 0.2 None %    Nucleated RBC 0.0 0.0 - 0.0 /100 WBC    Neutrophils Absolute 2.17 1 - 6 x10 3/uL    Lymphocytes Absolute Automated 1.58 0.42 - 3.22 x10 3/uL    Monocytes Absolute Automated 0.59 0.21 - 0.85 x10 3/uL    Eosinophils Absolute Automated 0.06 0.00 - 0.44 x10 3/uL    Basophils Absolute Automated 0.03 0.00 - 0.08 x10 3/uL    Immature Granulocytes Absolute 0.01 0.00 - 0.07 x10 3/uL    Absolute NRBC 0.00 0.00 - 0.00 x10 3/uL   Comprehensive metabolic panel    Collection  Time: 01/01/20  9:41 AM   Result Value Ref Range    Glucose 107 (H) 70 - 100 mg/dL  BUN 18.0 9 - 28 mg/dL    Creatinine 1.7 (H) 0.5 - 1.5 mg/dL    Sodium 161 096 - 045 mEq/L    Potassium 4.3 3.5 - 5.1 mEq/L    Chloride 103 100 - 111 mEq/L    CO2 26 21 - 29 mEq/L    Calcium 10.0 7.9 - 10.2 mg/dL    Protein, Total 7.9 6.0 - 8.3 g/dL    Albumin 4.0 3.5 - 5.0 g/dL    AST (SGOT) 27 5 - 34 U/L    ALT 24 0 - 55 U/L    Alkaline Phosphatase 45 38 - 106 U/L    Bilirubin, Total 1.1 0.1 - 1.2 mg/dL    Globulin 3.9 (H) 2.0 - 3.7 g/dL    Albumin/Globulin Ratio 1.0 0.9 - 2.2    Anion Gap 10.0 5 - 15   PSA    Collection Time: 01/01/20  9:41 AM   Result Value Ref Range    Prostate Specific Antigen, Total 6.988 (H) 0.000 - 4.000 ng/mL   Testosterone    Collection Time: 01/01/20  9:41 AM   Result Value Ref Range    Testosterone 218 (L) 241 - 827 ng/dL   GFR    Collection Time: 01/01/20  9:41 AM   Result Value Ref Range    EGFR 47.2    Whole Blood Creatinine with GFR POCT    Collection Time: 02/21/20  9:01 AM   Result Value Ref Range    Whole Blood Creatinine POCT 1.5 (H) 0.5 - 1.1 mg/dL    GFR POCT 57 (L) >40 mL/min/1.73 m2   .    Rads:   CT Abdomen Pelvis W IV Contrast Only    Result Date: 02/21/2020   1. Interim decreased size of retroperitoneal lymph nodes 2. Slight interval increase in size of enhancing hepatic dome mass and bilateral adrenal nodules previously felt to represent hepatic hemangioma and adrenal adenomas on MRI J. Carole Binning, MD  02/21/2020 9:51 AM        Assessment:     1. 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 Androgen deprivation therapy    3. Lower urinary tract symptoms (LUTS)    4. Cognitive deficits    5. Liver mass    6. Mass of both adrenal glands       Recommendations:     1. Adenocarcinoma of the prostate, stage IV (TX N1 M1a), castrate-sensitive. Oncologic history dating back to initial diagnosis in 2006 is summarized above. Prior to establishing care with me, he was receiving ADT  intermittently for biochemical recurrence, then for nodal metastases. By the time I met him in March 2021 for a rising PSA level, he had been off ADT for at least 2 years and his PSA had risen up to 10.5 by 08/17/2019. CT c/a/p and MRI abdomen done in May/June 2021 were notable for retroperitoneal adenopathy. CT-guided retroperitoneal LN biopsy on 11/21/2019 confirmed diagnosis of metastatic prostate cancer. Bone scan on 01/01/2020 was negative for e/o bone metastases. He was started back on ADT with Casodex at the beginning of July and received q39mo Eligard on 12/14/2019 (next dose due in Jan 2021). He continues to tolerate ADT well and his PSA has decreased from 33.2 in July 2021, down to 7 by 01/01/2020. His CT a/p on 02/21/2020 demonstrated decrease in size of his retroperitoneal LN, consistent with response to ADT. Slight increase in size of lesions in the adrenal glands and liver (previously felt  to be adenomas and hemangioma, respectively) seems less likely to be related to his prostate cancer (see below).  I did discuss potentially starting him on a secondary antiandrogen (abiraterone/pred may be favored over AR blocker such as enzalutamide given cognitive issues and history of falls/syncope), but he wishes to hold off until he returns in Jan for his next dose of Eligard after a 10-month long trip to [place]. We agreed on another restaging bone scan and CT c/a/p in Jan 2021 prior to his next appt here.     2. Androgen deprivation therapy/Osteopenia. DEXA scan in May 2021 c/w osteopenia of the left femoral neck. Normal BMD in the spine. Continue Ca and vit D supplementation for bone health. Will consider adding Prolia later.     3. LUTS. Likely 2/2 overactive bladder. Followed by Drs. Wilson and Rice. Now on mirabegron and tamsulosin. S/P cystoscopy on 08/09/2019 which was unremarkable. Prostate gland not significantly enlarged. Nocturia is rather severe, so he agreed to f/u with urology.     4. Cognitive deficits/syncopal  episode. Prior brain MRI ordered by PCP with no acute pathology. Chronic ischemic changes, scattered old microhemorrhages and lacunar infarcts noted. Neurology referral still pending. Severity of dizzy spells improved with tapering down on BP meds by PCP.     5. Liver mass/bilateral adrenal masses. Previously worked up by MRI and felt to be 2/2 hepatic hemangioma and adrenal adenomas. Slight growth in these lesions noted on recent CT. Will f/u again by CT in Jan 2022 and consider biopsies or repeat MRI iff there is further growth.     6. Miscellaneous. He has lived in [place] but moved to the area in Sept 2020 and plans on spending half the year here and the other half in [place] where he has another daughter. He and his daughter here [name]) state that they intend on all keeping all medical appts and treatments here. They are agreeable to having him travel back and forth for any tests and appts that are needed.     7. Follow-up. We agreed for Mr. Deroos to return on 06/13/2020 for labs, follow-up and Eligard.  I encouraged the patient to call or message me via MyChart with any questions or concerns prior to their next appointment here. They had many relevant questions which I answered to the best of my ability and to their apparent satisfaction. They voiced understanding and agreed to the plan outlined above.       LOS Documentation:    Number and Complexity of Problems Addressed:  2 or more stable chronic illnesses    Amount and/or Complexity of Data Reviewed:  - Reviewed the following results from 2021: 3+ unique labs and 1 unique imaging test  - Ordered the following unique test(s): 3+ unique labs and 2 unique imaging tests  - Performed an assessment requiring an independent historian - daughter    Risk of Complications and/or Morbidity or Mortality of Patient Management:  Prescription drug management        I spent a total of 40 minutes reviewing the patient's chart, including relevant radiological and  laboratory findings, pathology reports, and available outside records, counseling, medical decision making, coordination of care, and documentation for today's encounter.    Disclaimer: This note was dictated with voice recognition software. Similar sounding words can inadvertently be transcribed and may not be corrected upon review.    Signed by:    Serena Croissant, MD  Medical Oncologist  [facility]  [address] [facility] 5th [address]  [place], [place] [address]  Tel: [phone]  Fax: [phone]     Ronald Lobo  10/1/202110:51 AM

## 2020-03-03 ENCOUNTER — Telehealth: Payer: Self-pay

## 2020-03-03 NOTE — Telephone Encounter (Signed)
Left message for patient's daughter, [name], in regards to patient's recent lab results.

## 2020-03-04 ENCOUNTER — Other Ambulatory Visit: Payer: Self-pay

## 2020-03-04 ENCOUNTER — Telehealth: Payer: Self-pay

## 2020-03-04 DIAGNOSIS — Z79818 Long term (current) use of other agents affecting estrogen receptors and estrogen levels: Secondary | ICD-10-CM

## 2020-03-04 DIAGNOSIS — R7989 Other specified abnormal findings of blood chemistry: Secondary | ICD-10-CM

## 2020-03-04 DIAGNOSIS — C61 Malignant neoplasm of prostate: Secondary | ICD-10-CM

## 2020-03-04 NOTE — Telephone Encounter (Addendum)
Spoke with Bea at the [facility] office in [place]. Scheduled patient for an appointment on 04/01/20 at 10:15 am. Will fax referral, office note, and recent labs to the clinic at [phone].    [address]  [address]  [place], [place] [address]

## 2020-03-04 NOTE — Telephone Encounter (Signed)
Spoke with patient's daughter, [name]. Informed her that Dr. Cathie Hoops reviewed her father's lab results from 02/29/20. His PSA is trending down, but his labs indicated that his kidney function is getting slightly worse. Notified patient's daughter that Dr. Cathie Hoops recommends for the patient to see a nephrologist before he is back in the area in January. [place]. Will review with Dr. Cathie Hoops and place a referral to a local nephrologist. She agreed and verbalized understanding.

## 2020-03-04 NOTE — Telephone Encounter (Addendum)
Spoke with patient's daughter, [name]. Informed her of nephrologist appointment scheduled at [facility] in [place], [place] on 04/01/20 at 10:15 am. States she is unsure if her father will be in the area or in [place] at that time, but she will keep the appointment as scheduled. She agreed and verbalized understanding.

## 2020-03-04 NOTE — Telephone Encounter (Signed)
Faxed referral, office note, and recent labs to [facility] at [phone].

## 2020-03-07 ENCOUNTER — Telehealth (INDEPENDENT_AMBULATORY_CARE_PROVIDER_SITE_OTHER): Payer: Self-pay | Admitting: Family Medicine

## 2020-03-07 NOTE — Telephone Encounter (Signed)
Please be advised pt has been scheduled for flu, pnemoniua and shingles shots on Monday 10/11

## 2020-03-10 ENCOUNTER — Ambulatory Visit (INDEPENDENT_AMBULATORY_CARE_PROVIDER_SITE_OTHER): Payer: Medicare Other

## 2020-03-10 VITALS — Temp 98.1°F

## 2020-03-10 DIAGNOSIS — Z23 Encounter for immunization: Secondary | ICD-10-CM

## 2020-03-10 NOTE — Progress Notes (Signed)
Patient presented to the office for flu vaccine  administration.  Received injection in the Right arm.  No reaction was noted and patient left in good condition.           Patient presented to the office for Pneumovax 23 administration.  Received injection in the Left arm.  No reaction was noted and patient left in good condition.

## 2020-04-04 ENCOUNTER — Encounter: Payer: Self-pay | Admitting: Hematology & Oncology

## 2020-04-04 ENCOUNTER — Other Ambulatory Visit (INDEPENDENT_AMBULATORY_CARE_PROVIDER_SITE_OTHER): Payer: Self-pay | Admitting: Family Medicine

## 2020-04-04 ENCOUNTER — Encounter (INDEPENDENT_AMBULATORY_CARE_PROVIDER_SITE_OTHER): Payer: Self-pay | Admitting: Family Medicine

## 2020-04-04 DIAGNOSIS — R3912 Poor urinary stream: Secondary | ICD-10-CM

## 2020-04-07 ENCOUNTER — Other Ambulatory Visit (INDEPENDENT_AMBULATORY_CARE_PROVIDER_SITE_OTHER): Payer: Self-pay | Admitting: Family Medicine

## 2020-04-07 DIAGNOSIS — R3912 Poor urinary stream: Secondary | ICD-10-CM

## 2020-04-07 MED ORDER — MYRBETRIQ 50 MG PO TB24
1.0000 | ORAL_TABLET | Freq: Every day | ORAL | 1 refills | Status: DC
Start: 2020-04-07 — End: 2020-07-10

## 2020-04-07 NOTE — Telephone Encounter (Signed)
Medication: filled today. See below  Date: 04/07/2020 Department: [facility] Ordering/Authorizing: Bland Span, DO       Outpatient Medication Detail     Disp Refills Start End    Mirabegron ER (Myrbetriq) 50 MG Tablet SR 24 hr 90 tablet 1 04/07/2020     Sig - Route: Take 1 tablet (50 mg total) by mouth daily - Oral    Sent to pharmacy as: Mirabegron ER (Myrbetriq) 50 MG Tablet SR 24 hr    Class: E-Rx    E-Prescribing Status: Receipt confirmed by pharmacy (04/07/2020 9:37 AM EST)      Associated Diagnoses    Weak urinary stream         Pharmacy    [facility] [address] [place], [place] - [address]         Last Appointment: 02/11/20 via telemedicine for dizziness  Last Labs: 02/29/20  Next Appointment:

## 2020-04-08 NOTE — Progress Notes (Signed)
Medication  Date: 04/07/2020 Department: [facility] Ordering/Authorizing: Bland Span, DO       Outpatient Medication Detail     Disp Refills Start End    Mirabegron ER (Myrbetriq) 50 MG Tablet SR 24 hr 90 tablet 1 04/07/2020     Sig - Route: Take 1 tablet (50 mg total) by mouth daily - Oral    Sent to pharmacy as: Mirabegron ER (Myrbetriq) 50 MG Tablet SR 24 hr    Class: E-Rx    E-Prescribing Status: Receipt confirmed by pharmacy (04/07/2020 9:37 AM EST)      Associated Diagnoses    Weak urinary stream         Pharmacy    [id number] - [place], [place] - [address]

## 2020-04-18 ENCOUNTER — Other Ambulatory Visit (INDEPENDENT_AMBULATORY_CARE_PROVIDER_SITE_OTHER): Payer: Self-pay | Admitting: Family Medicine

## 2020-04-18 DIAGNOSIS — E782 Mixed hyperlipidemia: Secondary | ICD-10-CM

## 2020-04-18 DIAGNOSIS — I10 Essential (primary) hypertension: Secondary | ICD-10-CM

## 2020-04-18 DIAGNOSIS — F32A Depression, unspecified: Secondary | ICD-10-CM

## 2020-04-18 NOTE — Telephone Encounter (Signed)
Patient called requesting medication refill for simvastatin (ZOCOR) 20 MG tablet,  venlafaxine (EFFEXOR) 75 MG tablet,   hydrALAZINE (APRESOLINE) 50 MG tablet to be sent to:    [facility] [address] [place], [place] - [address]   Phone: [phone]  Fax:  [phone]

## 2020-04-21 MED ORDER — VENLAFAXINE HCL 75 MG PO TABS
75.0000 mg | ORAL_TABLET | Freq: Every day | ORAL | 1 refills | Status: DC
Start: 2020-04-21 — End: 2020-07-10

## 2020-04-21 MED ORDER — HYDRALAZINE HCL 50 MG PO TABS
50.0000 mg | ORAL_TABLET | Freq: Three times a day (TID) | ORAL | 1 refills | Status: DC
Start: 2020-04-21 — End: 2020-06-30

## 2020-04-21 MED ORDER — SIMVASTATIN 20 MG PO TABS
20.0000 mg | ORAL_TABLET | Freq: Every evening | ORAL | 1 refills | Status: DC
Start: 2020-04-21 — End: 2020-07-10

## 2020-04-21 NOTE — Telephone Encounter (Signed)
Medication: simvastatin (ZOCOR) 20 MG tablet, venlafaxine (EFFEXOR) 75 MG tablet, hydrALAZINE (APRESOLINE) 50 MG tablet  Last filled: 10/23/19 with a 6 month supply    Last Appointment: 02/11/20 for Dizziness  Last Labs:02/29/20  Next Appointment:

## 2020-05-30 ENCOUNTER — Other Ambulatory Visit (INDEPENDENT_AMBULATORY_CARE_PROVIDER_SITE_OTHER): Payer: Self-pay | Admitting: Family Medicine

## 2020-05-30 DIAGNOSIS — E782 Mixed hyperlipidemia: Secondary | ICD-10-CM

## 2020-05-30 DIAGNOSIS — F32A Depression, unspecified: Secondary | ICD-10-CM

## 2020-06-04 NOTE — Telephone Encounter (Signed)
Medication: venlafaxine (EFFEXOR) 75 MG tablet & simvastatin (ZOCOR) 20 MG tablet  Last filled: 04/21/20 with a 6 month supply  Last Appointment: 02/11/20 for dizziness

## 2020-06-05 ENCOUNTER — Telehealth: Payer: Self-pay | Admitting: Hematology & Oncology

## 2020-06-05 ENCOUNTER — Telehealth: Payer: Self-pay

## 2020-06-05 NOTE — Telephone Encounter (Signed)
Patient's daughter called saying that patient tested positive for COVID. Patient's scans were re-scheduled to 2/11 but the next availability for Dr. Cathie Hoops is 2/23. Is it okay to book patient on 2/23?

## 2020-06-05 NOTE — Telephone Encounter (Signed)
Appts have been rescheduled, patient's daughter is aware.

## 2020-06-05 NOTE — Telephone Encounter (Signed)
Yes, that would be OK. I'm away for a conference the week prior.     Thanks!    AY

## 2020-06-05 NOTE — Telephone Encounter (Signed)
Daughter called to reschedule appointments. Patient has tested positive for COVID.    Call transferred to front desk to assist with rescheduling

## 2020-06-09 ENCOUNTER — Ambulatory Visit: Payer: Medicare Other

## 2020-06-13 ENCOUNTER — Ambulatory Visit: Payer: Medicare Other | Admitting: Hematology & Oncology

## 2020-06-13 ENCOUNTER — Ambulatory Visit: Payer: Medicare Other

## 2020-06-13 ENCOUNTER — Other Ambulatory Visit: Payer: Medicare Other

## 2020-06-30 ENCOUNTER — Telehealth (INDEPENDENT_AMBULATORY_CARE_PROVIDER_SITE_OTHER): Payer: Self-pay | Admitting: Family Medicine

## 2020-06-30 DIAGNOSIS — I10 Essential (primary) hypertension: Secondary | ICD-10-CM

## 2020-06-30 MED ORDER — HYDRALAZINE HCL 50 MG PO TABS
50.0000 mg | ORAL_TABLET | Freq: Three times a day (TID) | ORAL | 0 refills | Status: DC
Start: 2020-06-30 — End: 2020-07-10

## 2020-06-30 NOTE — Telephone Encounter (Signed)
Name, strength, directions of requested refill(s):  hydrALAZINE (APRESOLINE) 50 MG tablet  Pt is out of medication  Pharmacy to send refill to or patient to pick up rx from office (mark requested pharmacy in BOLD):      [facility] [address], [place] - [address] [facility]  [address]  [place] [address]  Phone: [phone] Fax: [phone]    [facility] [address] [place] - [address]  [address]  [place] [address]  Phone: [phone] Fax: [phone]        Please mark "X" next to the preferred call back number:    Mobile:   Telephone Information:   Mobile [phone]       Home: @HOMEPHONE @    Work: @WORKPHONE @          Next visit: 07/10/2020       Please advise, thank you.

## 2020-06-30 NOTE — Telephone Encounter (Signed)
Is it possible to send him meds until his appt, please advise, thanks.

## 2020-07-08 ENCOUNTER — Encounter: Payer: Self-pay | Admitting: Hematology & Oncology

## 2020-07-10 ENCOUNTER — Encounter (INDEPENDENT_AMBULATORY_CARE_PROVIDER_SITE_OTHER): Payer: Self-pay | Admitting: Family Medicine

## 2020-07-10 ENCOUNTER — Ambulatory Visit (FREE_STANDING_LABORATORY_FACILITY): Payer: Medicare Other | Admitting: Family Medicine

## 2020-07-10 VITALS — BP 125/60 | HR 90 | Temp 98.6°F | Resp 18 | Ht 67.0 in | Wt 163.8 lb

## 2020-07-10 DIAGNOSIS — C772 Secondary and unspecified malignant neoplasm of intra-abdominal lymph nodes: Secondary | ICD-10-CM

## 2020-07-10 DIAGNOSIS — Z0000 Encounter for general adult medical examination without abnormal findings: Secondary | ICD-10-CM

## 2020-07-10 DIAGNOSIS — I10 Essential (primary) hypertension: Secondary | ICD-10-CM

## 2020-07-10 DIAGNOSIS — R3912 Poor urinary stream: Secondary | ICD-10-CM

## 2020-07-10 DIAGNOSIS — C61 Malignant neoplasm of prostate: Secondary | ICD-10-CM

## 2020-07-10 DIAGNOSIS — N1832 Chronic kidney disease, stage 3b: Secondary | ICD-10-CM

## 2020-07-10 DIAGNOSIS — F32A Depression, unspecified: Secondary | ICD-10-CM

## 2020-07-10 DIAGNOSIS — R739 Hyperglycemia, unspecified: Secondary | ICD-10-CM

## 2020-07-10 DIAGNOSIS — E782 Mixed hyperlipidemia: Secondary | ICD-10-CM

## 2020-07-10 LAB — LIPID PANEL
Cholesterol / HDL Ratio: 1.8
Cholesterol: 127 mg/dL (ref 0–199)
HDL: 72 mg/dL (ref 40–9999)
LDL Calculated: 39 mg/dL (ref 0–99)
Triglycerides: 78 mg/dL (ref 34–149)
VLDL Calculated: 16 mg/dL (ref 10–40)

## 2020-07-10 LAB — COMPREHENSIVE METABOLIC PANEL
ALT: 35 U/L (ref 0–55)
AST (SGOT): 33 U/L (ref 5–34)
Albumin/Globulin Ratio: 1.2 (ref 0.9–2.2)
Albumin: 3.8 g/dL (ref 3.5–5.0)
Alkaline Phosphatase: 46 U/L (ref 37–117)
Anion Gap: 6 (ref 5.0–15.0)
BUN: 20 mg/dL (ref 9.0–28.0)
Bilirubin, Total: 0.9 mg/dL (ref 0.2–1.2)
CO2: 27 mEq/L (ref 21–29)
Calcium: 9.3 mg/dL (ref 7.9–10.2)
Chloride: 101 mEq/L (ref 100–111)
Creatinine: 1.4 mg/dL (ref 0.5–1.5)
Globulin: 3.2 g/dL (ref 2.0–3.7)
Glucose: 107 mg/dL — ABNORMAL HIGH (ref 70–100)
Potassium: 4.2 mEq/L (ref 3.5–5.1)
Protein, Total: 7 g/dL (ref 6.0–8.3)
Sodium: 134 mEq/L — ABNORMAL LOW (ref 136–145)

## 2020-07-10 LAB — GFR: EGFR: 59

## 2020-07-10 LAB — HEMOGLOBIN A1C
Average Estimated Glucose: 122.6 mg/dL
Hemoglobin A1C: 5.9 % (ref 4.6–5.9)

## 2020-07-10 LAB — HEMOLYSIS INDEX: Hemolysis Index: 2 (ref 0–24)

## 2020-07-10 MED ORDER — VENLAFAXINE HCL 75 MG PO TABS
75.0000 mg | ORAL_TABLET | Freq: Every day | ORAL | 1 refills | Status: DC
Start: 2020-07-10 — End: 2021-03-05

## 2020-07-10 MED ORDER — SIMVASTATIN 20 MG PO TABS
20.0000 mg | ORAL_TABLET | Freq: Every evening | ORAL | 3 refills | Status: DC
Start: 2020-07-10 — End: 2021-03-19

## 2020-07-10 MED ORDER — HYDRALAZINE HCL 50 MG PO TABS
50.0000 mg | ORAL_TABLET | Freq: Three times a day (TID) | ORAL | 3 refills | Status: DC
Start: 2020-07-10 — End: 2021-03-19

## 2020-07-10 MED ORDER — MYRBETRIQ 50 MG PO TB24
1.0000 | ORAL_TABLET | Freq: Every day | ORAL | 3 refills | Status: DC
Start: 2020-07-10 — End: 2021-02-09

## 2020-07-10 MED ORDER — ISOSORBIDE DINITRATE 20 MG PO TABS
20.0000 mg | ORAL_TABLET | Freq: Two times a day (BID) | ORAL | 3 refills | Status: DC
Start: 2020-07-10 — End: 2021-03-19

## 2020-07-10 NOTE — Progress Notes (Signed)
Have you seen any specialists/other providers since your last visit with us?    No    Arm preference verified?   Yes    The patient is due for spirometry and shingles vaccine

## 2020-07-10 NOTE — Progress Notes (Signed)
[facility]            [name] is a 80 y.o. male who presents today for the following Medicare Wellness Visit:  []  Initial Preventive Physical Exam (IPPE) - "Welcome to Medicare" preventive visit (Vision Screening required)   []  Annual Wellness Visit - Initial  [x]  Annual Wellness Visit - Subsequent     Health Risk Assessment:   During the past month, how would you rate your general health?:  Good  Which of the following tasks can you do without assistance - drive or take the bus alone; shop for groceries or clothes; prepare your own meals; do your own housework/laundry; handle your own finances/pay bills; eat, bathe or get around your home?: Eat, bathe, dress or get around your home,Prepare your own meals  Which of the following problems have you been bothered by in the past month - dizzy when standing up; problems using the phone; feeling tired or fatigued; moderate or severe body pain?: Moderate or severe body pain  Do you exercise for about 20 minutes 3 or more days per week?:Yes  During the past month was someone available to help if you needed and wanted help?  For example, if you felt nervous, lonely, got sick and had to stay in bed, needed someone to talk to, needed help with daily chores or needed help just taking care of yourself.: Yes  Do you always wear a seat belt?: Yes  Do you have any trouble taking medications the way you have been told to take them?: No  Have you been given any information that can help you with keeping track of your medications?: No  Do you have trouble paying for your medications?: No  Have you been given any information that can help you with hazards in your house, such as scatter rugs, furniture, etc?: No  Do you feel unsteady when standing or walking?: Yes  Do you worry about falling?: Yes  Have you fallen two or more times in the past year?: No  Did you suffer any injuries from your falls in the past year?: No     Care Team:   Patient Care  Team:  Bland Span, DO as PCP - General (Family Medicine)  Renard Hamper, MD as Consulting Physician (Cardiology)      Hospitalizations:   Hospitalization within past year: [x]  No  []  Yes     Diagnosis:      Screenings:   Ambulatory Screenings 10/23/2019 02/11/2020 07/10/2020   Falls Risk: De Hollingshead more than 2 times in past year N - N   Falls Risk: Suffer any injuries? N - N   Depression: PHQ2 Total Score - 0 -   Depression: PHQ9 Total Score - 0 -        Substance Use Disorder Screen:  In the past year, how often have you used the following?  1) Alcohol (For men, 5 or more drinks a day. For women, 4 or more drinks a day)  [x]  Never []  Once or Twice []  Monthly []  Weekly []  Daily or Almost Daily  2) Tobacco Products  [x]  Never []  Once or Twice []  Monthly []  Weekly []  Daily or Almost Daily  3) Prescription Drugs for Non-Medical Reasons  [x]  Never []  Once or Twice []  Monthly []  Weekly []  Daily or Almost Daily  4) Illegal Drugs  [x]  Never []  Once or Twice []  Monthly []  Weekly []  Daily or Almost Daily  Functional Ability/Level of Safety:   Falls Risk/Home Safety Assessment:  ( see HRA and Screenings sections for additional assessment)  Home Safety: []  Stair handrails  []  Skid-resistant rugs/remove throw rugs   []  Grab bars  []  Clear pathways between rooms  []  Proper lighting stairs/ bathrooms/bedrooms  Get Up and Go (optional):  []   <20 secs  []   >20 secs    []   High risk for falls - Home Safety/Falls Risk Precautions reviewed with pt/family    Hearing Assessment:  Concerns for hearing loss: []  Yes  []   No  Hearing aids:   []   Right  []   Left  []   Bilateral   []   None  Whisper Test (optional):  []  Normal  []   Slightly decreased  []   Significantly decreased    Exercise:  Frequency:  []   No formal exercise  []   1-2x/wk  []   3-4x/wk  []   >4x/wk  Duration:  []   15-30 mins/day  []   30-45 mins/day  []   45+ mins/day  Intensity:  []   Light  []   Moderate  []   Heavy        Activities of Daily Living:   ADL's  Independent Minimal  Assistance Moderate  Assistance Total   Assistance   Bathing [x]  []  []  []    Dressing [x]  []  []  []    Mobility   [x]  []  []  []    Transfer [x]  []  []  []    Eating [x]  []  []  []    Toileting [x]  []  []  []      IADL's Independent Minimal  Assistance Moderate  Assistance Total   Assistance   Phone [x]  []  []  []    Housekeeping [x]  []  []  []    Laundry [x]  []  []  []    Transportation [x]  []  []  []    Medications [x]  []  []  []    Finances [x]  []  []  []       ADL assistance: [x]  No assistance needed  []  Spouse  []  Sibling  []  Son   []  Daughter []  Children  []  Home Health Aide []  Other:       Advance Care Planning:   Discussion of Advance Directives:   []  Advance Directive in chart  []  Advance Directive not in chart - requested to provide [x]  No Advance Directive.  Form Provided  []  No Advance Directive.  Pt declines. []  Not addressed today  []  Other:     Exam:   BP 125/60 (BP Site: Left arm, Patient Position: Sitting, Cuff Size: Medium)   Pulse 90   Temp 98.6 F (37 C) (Temporal)   Resp 18   Ht 1.702 m (5\' 7" )   Wt 74.3 kg (163 lb 12.8 oz)   SpO2 98%   BMI 25.65 kg/m      Physical Exam  Vitals and nursing note reviewed.   Constitutional:       Appearance: Normal appearance.   HENT:      Head: Normocephalic and atraumatic.   Eyes:      Extraocular Movements: Extraocular movements intact.      Pupils: Pupils are equal, round, and reactive to light.   Cardiovascular:      Rate and Rhythm: Normal rate.      Pulses: Normal pulses.      Heart sounds: No murmur heard.      Pulmonary:      Effort: Pulmonary effort is normal.      Breath sounds: Normal breath sounds.   Abdominal:      General: There is no  distension.      Tenderness: There is no abdominal tenderness.   Musculoskeletal:         General: Normal range of motion.      Cervical back: Normal range of motion.   Neurological:      General: No focal deficit present.      Mental Status: He is alert.               Evaluation of Cognitive Function:   Mood/affect: [x]   Appropriate  []   Other:   Appearance: [x]  Neatly groomed  [x]  Adequately nourished  []  Other:  Family member/caregiver input: []  Present - no concerns  []   Not present in room  []  Present - concerns:    Cognitive Assessment:  Mini-Cog Result (three word registration- banana, sunrise, chair / clock drawing):   [x]   > 3 points - negative screen for dementia   []  3 recalled words - negative screen for dementia   []  1-2 recalled words and normal clock draw - negative for cognitive impairment   []  1-2 recalled words and abnormal clock draw - positive for cognitive impairment   []  0 recalled words - positive for cognitive impairment         Assessment/Plan:   1. Medicare annual wellness visit, subsequent  -To facilitate a health maintance visit .I performed a complete health maintenance exam comprehensive examination as documented above.     2. 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 Depression, unspecified depression type  - venlafaxine (EFFEXOR) 75 MG tablet; Take 1 tablet (75 mg total) by mouth daily  Dispense: 90 tablet; Refill: 1    5. Weak urinary stream  - Mirabegron ER (Myrbetriq) 50 MG Tablet SR 24 hr; Take 1 tablet (50 mg total) by mouth daily  Dispense: 90 tablet; Refill: 3    6. Essential hypertension  - isosorbide dinitrate (ISORDIL) 20 MG tablet; Take 1 tablet (20 mg total) by mouth 2 (two) times daily  Dispense: 180 tablet; Refill: 3  - hydrALAZINE (APRESOLINE) 50 MG tablet; Take 1 tablet (50 mg total) by mouth 3 (three) times daily Take 1 tablet by mouth three times daily  Dispense: 270 tablet; Refill: 3    7. Mixed hyperlipidemia  - simvastatin (ZOCOR) 20 MG tablet; Take 1 tablet (20 mg total) by mouth nightly Take one tablet by mouth at bedtime  Dispense: 90 tablet; Refill: 3  - Lipid panel  - Hemoglobin A1C    8. Hyperglycemia  - Hemoglobin A1C  - Comprehensive metabolic panel           Tanner Lewis  Owusu-Boateng, DO    07/16/2020     The following sections were reviewed this encounter by the provider:   Tobacco  Allergies  Meds  Problems  Med Hx  Surg Hx  Fam Hx          History:   Patient Active Problem List   Diagnosis   . Essential hypertension   . History of prostate cancer   . Fatigue, unspecified type   . Keeps losing balance   . Age-related cataract of both eyes, unspecified age-related cataract type   . 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 Nocturia   . Weak urinary stream   . Rising PSA  following treatment for malignant neoplasm of prostate   . Right renal mass   . Mass of both adrenal glands   . 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 Stage 3b chronic kidney disease      Past Medical History:   Diagnosis Date   . Hypertension    . Prostate cancer 2010     Past Surgical History:   Procedure Laterality Date   . BRAIN SURGERY  2000   . CATARACT EXTRACTION  2019    Both eyes      Allergies   Allergen Reactions   . Solarcaine [Benzocaine] Rash     Happened over 5 years ago.      Outpatient Medications Marked as Taking for the 07/10/20 encounter (Office Visit) with Elray Buba B, DO   Medication Sig Dispense Refill   . amLODIPine-benazepril (LOTREL 5-10) 5-10 MG per capsule Take 1 capsule by mouth daily 90 capsule 1   . hydrALAZINE (APRESOLINE) 50 MG tablet Take 1 tablet (50 mg total) by mouth 3 (three) times daily Take 1 tablet by mouth three times daily 270 tablet 3   . isosorbide dinitrate (ISORDIL) 20 MG tablet Take 1 tablet (20 mg total) by mouth 2 (two) times daily 180 tablet 3   . Mirabegron ER (Myrbetriq) 50 MG Tablet SR 24 hr Take 1 tablet (50 mg total) by mouth daily 90 tablet 3   . Misc Natural Products (PROSTATE SUPPORT PO) Take by mouth Twice daily.     . simvastatin (ZOCOR) 20 MG tablet Take 1 tablet (20 mg total) by mouth nightly Take one tablet by mouth at bedtime 90 tablet 3    . venlafaxine (EFFEXOR) 75 MG tablet Take 1 tablet (75 mg total) by mouth daily 90 tablet 1   . [DISCONTINUED] hydrALAZINE (APRESOLINE) 50 MG tablet Take 1 tablet (50 mg total) by mouth 3 (three) times daily Take 1 tablet by mouth three times daily 270 tablet 0   . [DISCONTINUED] isosorbide dinitrate (ISORDIL) 20 MG tablet Take 1 tablet (20 mg total) by mouth 2 (two) times daily 180 tablet 1   . [DISCONTINUED] Mirabegron ER (Myrbetriq) 50 MG Tablet SR 24 hr Take 1 tablet (50 mg total) by mouth daily 90 tablet 1   . [DISCONTINUED] simvastatin (ZOCOR) 20 MG tablet Take 1 tablet (20 mg total) by mouth nightly Take one tablet by mouth at bedtime 90 tablet 1   . [DISCONTINUED] tamsulosin (FLOMAX) 0.4 MG Cap Take 1 capsule (0.4 mg total) by mouth Daily after dinner Take 1 capsule by mouth daily. 90 capsule 1   . [DISCONTINUED] venlafaxine (EFFEXOR) 75 MG tablet Take 1 tablet (75 mg total) by mouth daily 90 tablet 1     Social History     Tobacco Use   . Smoking status: Former Smoker     Packs/day: 1.50     Years: 20.00     Pack years: 30.00     Types: Cigarettes     Quit date: 05/10/2014     Years since quitting: 6.1   . Smokeless tobacco: Never Used   Vaping Use   . Vaping Use: Never used   Substance Use Topics   . Alcohol use: Not Currently     Alcohol/week: 8.0 standard drinks     Types: 2 Glasses of wine, 3 Cans of beer, 3 Shots of liquor per week   . Drug use: Never      Family History   Problem Relation  Age of Onset   . Diabetes Mother    . Diabetes Brother    . Diabetes Son    . Glaucoma Son    . Anesthesia problems Neg Hx    . Cancer Neg Hx    . Clotting disorder Neg Hx    . GU problems Neg Hx    . Heart disease Neg Hx    . Hypertension Neg Hx    . Kidney cancer Neg Hx    . Malignant hyperthermia Neg Hx    . Kidney disease Neg Hx    . Prostate cancer Neg Hx    . Sickle cell trait Neg Hx    . Lupus Neg Hx    . Sudden death Neg Hx    . Urolithiasis Neg Hx             ===================================================================    Additional Documentation:

## 2020-07-10 NOTE — Progress Notes (Signed)
[place] [facility]            Tanner Lewis is a 80 y.o. male who presents today for the following Medicare Wellness Visit:  []  Initial Preventive Physical Exam (IPPE) - "Welcome to Medicare" preventive visit (Vision Screening required)   []  Annual Wellness Visit - Initial  [x]  Annual Wellness Visit - Subsequent     Health Risk Assessment:   During the past month, how would you rate your general health?:  Good  Which of the following tasks can you do without assistance - drive or take the bus alone; shop for groceries or clothes; prepare your own meals; do your own housework/laundry; handle your own finances/pay bills; eat, bathe or get around your home?: Eat, bathe, dress or get around your home,Prepare your own meals  Which of the following problems have you been bothered by in the past month - dizzy when standing up; problems using the phone; feeling tired or fatigued; moderate or severe body pain?: Moderate or severe body pain  Do you exercise for about 20 minutes 3 or more days per week?:Yes  During the past month was someone available to help if you needed and wanted help?  For example, if you felt nervous, lonely, got sick and had to stay in bed, needed someone to talk to, needed help with daily chores or needed help just taking care of yourself.: Yes  Do you always wear a seat belt?: Yes  Do you have any trouble taking medications the way you have been told to take them?: No  Have you been given any information that can help you with keeping track of your medications?: No  Do you have trouble paying for your medications?: No  Have you been given any information that can help you with hazards in your house, such as scatter rugs, furniture, etc?: No  Do you feel unsteady when standing or walking?: Yes  Do you worry about falling?: Yes  Have you fallen two or more times in the past year?: No  Did you suffer any injuries from your falls in the past year?: No     Care Team:   Patient Care  Team:  Bland Span, DO as PCP - General (Family Medicine)  Renard Hamper, MD as Consulting Physician (Cardiology)      Hospitalizations:   Hospitalization within past year: [x]  No  []  Yes     Diagnosis:      Screenings:   Ambulatory Screenings 10/23/2019 02/11/2020 07/10/2020   Falls Risk: De Hollingshead more than 2 times in past year N - N   Falls Risk: Suffer any injuries? N - N   Depression: PHQ2 Total Score - 0 -   Depression: PHQ9 Total Score - 0 -        Substance Use Disorder Screen:  In the past year, how often have you used the following?  1) Alcohol (For men, 5 or more drinks a day. For women, 4 or more drinks a day)  [x]  Never []  Once or Twice []  Monthly []  Weekly []  Daily or Almost Daily  2) Tobacco Products  [x]  Never []  Once or Twice []  Monthly []  Weekly []  Daily or Almost Daily  3) Prescription Drugs for Non-Medical Reasons  [x]  Never []  Once or Twice []  Monthly []  Weekly []  Daily or Almost Daily  4) Illegal Drugs  [x]  Never []  Once or Twice []  Monthly []  Weekly []  Daily or Almost Daily  Functional Ability/Level of Safety:   Falls Risk/Home Safety Assessment:  ( see HRA and Screenings sections for additional assessment)  Home Safety: []  Stair handrails  []  Skid-resistant rugs/remove throw rugs   []  Grab bars  []  Clear pathways between rooms  []  Proper lighting stairs/ bathrooms/bedrooms  Get Up and Go (optional):  []   <20 secs  []   >20 secs    []   High risk for falls - Home Safety/Falls Risk Precautions reviewed with pt/family    Hearing Assessment:  Concerns for hearing loss: []  Yes  []   No  Hearing aids:   []   Right  []   Left  []   Bilateral   []   None  Whisper Test (optional):  []  Normal  []   Slightly decreased  []   Significantly decreased    Exercise:  Frequency:  []   No formal exercise  []   1-2x/wk  []   3-4x/wk  []   >4x/wk  Duration:  []   15-30 mins/day  []   30-45 mins/day  []   45+ mins/day  Intensity:  []   Light  []   Moderate  []   Heavy        Activities of Daily Living:   ADL's  Independent Minimal  Assistance Moderate  Assistance Total   Assistance   Bathing [x]  []  []  []    Dressing [x]  []  []  []    Mobility   [x]  []  []  []    Transfer [x]  []  []  []    Eating [x]  []  []  []    Toileting [x]  []  []  []      IADL's Independent Minimal  Assistance Moderate  Assistance Total   Assistance   Phone [x]  []  []  []    Housekeeping [x]  []  []  []    Laundry [x]  []  []  []    Transportation [x]  []  []  []    Medications [x]  []  []  []    Finances [x]  []  []  []       ADL assistance: [x]  No assistance needed  []  Spouse  []  Sibling  []  Son   []  Daughter []  Children  []  Home Health Aide []  Other:       Advance Care Planning:   Discussion of Advance Directives:   []  Advance Directive in chart  []  Advance Directive not in chart - requested to provide []  No Advance Directive.  Form Provided  []  No Advance Directive.  Pt declines. []  Not addressed today  []  Other:     Exam:   BP 125/60 (BP Site: Left arm, Patient Position: Sitting, Cuff Size: Medium)   Pulse 90   Temp 98.6 F (37 C) (Temporal)   Resp 18   Ht 1.702 m (5\' 7" )   Wt 74.3 kg (163 lb 12.8 oz)   SpO2 98%   BMI 25.65 kg/m      Physical Exam          Evaluation of Cognitive Function:   Mood/affect: [x]  Appropriate  []   Other:   Appearance: [x]  Neatly groomed  [x]  Adequately nourished  []  Other:  Family member/caregiver input: []  Present - no concerns  []   Not present in room  []  Present - concerns:    Cognitive Assessment:  Mini-Cog Result (three word registration- banana, sunrise, chair / clock drawing):   [x]   > 3 points - negative screen for dementia   []  3 recalled words - negative screen for dementia   []  1-2 recalled words and normal clock draw - negative for cognitive impairment   []  1-2 recalled words and abnormal clock draw - positive for cognitive impairment   []   0 recalled words - positive for cognitive impairment         Assessment/Plan:   There are no diagnoses linked to this encounter.         Janie Morning, LPN    1/61/0960     The following sections were  reviewed this encounter by the provider:         History:   Patient Active Problem List   Diagnosis   . Essential hypertension   . History of prostate cancer   . Fatigue, unspecified type   . Keeps losing balance   . Age-related cataract of both eyes, unspecified age-related cataract type   . 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 Nocturia   . Weak urinary stream   . Rising PSA following treatment for malignant neoplasm of prostate   . Right renal mass   . Mass of both adrenal glands   . 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 Hypertension    . Prostate cancer 2010     Past Surgical History:   Procedure Laterality Date   . BRAIN SURGERY  2000   . CATARACT EXTRACTION  2019    Both eyes      Allergies   Allergen Reactions   . Solarcaine [Benzocaine] Rash     Happened over 5 years ago.      Outpatient Medications Marked as Taking for the 07/10/20 encounter (Office Visit) with Elray Buba B, DO   Medication Sig Dispense Refill   . amLODIPine-benazepril (LOTREL 5-10) 5-10 MG per capsule Take 1 capsule by mouth daily 90 capsule 1   . hydrALAZINE (APRESOLINE) 50 MG tablet Take 1 tablet (50 mg total) by mouth 3 (three) times daily Take 1 tablet by mouth three times daily 270 tablet 0   . isosorbide dinitrate (ISORDIL) 20 MG tablet Take 1 tablet (20 mg total) by mouth 2 (two) times daily 180 tablet 1   . Mirabegron ER (Myrbetriq) 50 MG Tablet SR 24 hr Take 1 tablet (50 mg total) by mouth daily 90 tablet 1   . Misc Natural Products (PROSTATE SUPPORT PO) Take by mouth Twice daily.     . simvastatin (ZOCOR) 20 MG tablet Take 1 tablet (20 mg total) by mouth nightly Take one tablet by mouth at bedtime 90 tablet 1   . tamsulosin (FLOMAX) 0.4 MG Cap Take 1 capsule (0.4 mg total) by mouth Daily after dinner Take 1 capsule by mouth daily. 90 capsule 1   . venlafaxine (EFFEXOR) 75 MG  tablet Take 1 tablet (75 mg total) by mouth daily 90 tablet 1     Social History     Tobacco Use   . Smoking status: Former Smoker     Packs/day: 1.50     Years: 20.00     Pack years: 30.00     Types: Cigarettes     Quit date: 05/10/2014     Years since quitting: 6.1   . Smokeless tobacco: Never Used   Vaping Use   . Vaping Use: Never used   Substance Use Topics   . Alcohol use: Not Currently     Alcohol/week: 8.0 standard drinks     Types: 2 Glasses of wine, 3 Cans of beer, 3 Shots of liquor per week   . Drug use: Never  Family History   Problem Relation Age of Onset   . Diabetes Mother    . Diabetes Brother    . Diabetes Son    . Glaucoma Son    . Anesthesia problems Neg Hx    . Cancer Neg Hx    . Clotting disorder Neg Hx    . GU problems Neg Hx    . Heart disease Neg Hx    . Hypertension Neg Hx    . Kidney cancer Neg Hx    . Malignant hyperthermia Neg Hx    . Kidney disease Neg Hx    . Prostate cancer Neg Hx    . Sickle cell trait Neg Hx    . Lupus Neg Hx    . Sudden death Neg Hx    . Urolithiasis Neg Hx            ===================================================================    Additional Documentation:

## 2020-07-11 ENCOUNTER — Ambulatory Visit
Admission: RE | Admit: 2020-07-11 | Discharge: 2020-07-11 | Disposition: A | Discharge status: Home / Self Care | Payer: Medicare Other | Source: Ambulatory Visit | Attending: Hematology & Oncology | Admitting: Hematology & Oncology

## 2020-07-11 DIAGNOSIS — Z79818 Long term (current) use of other agents affecting estrogen receptors and estrogen levels: Secondary | ICD-10-CM | POA: Insufficient documentation

## 2020-07-11 DIAGNOSIS — C61 Malignant neoplasm of prostate: Secondary | ICD-10-CM | POA: Insufficient documentation

## 2020-07-11 DIAGNOSIS — C772 Secondary and unspecified malignant neoplasm of intra-abdominal lymph nodes: Secondary | ICD-10-CM

## 2020-07-11 DIAGNOSIS — M158 Other polyosteoarthritis: Secondary | ICD-10-CM | POA: Insufficient documentation

## 2020-07-11 MED ORDER — TECHNETIUM TC 99M OXIDRONATE INJECTION
21.5000 | Freq: Once | Status: AC | PRN
Start: 2020-07-11 — End: 2020-07-11
  Administered 2020-07-11: 12:00:00 22 via INTRAVENOUS
  Filled 2020-07-11: qty 30

## 2020-07-11 MED ORDER — IOHEXOL 350 MG/ML IV SOLN
100.0000 mL | Freq: Once | INTRAVENOUS | Status: AC | PRN
Start: 2020-07-11 — End: 2020-07-11
  Administered 2020-07-11: 13:00:00 100 mL via INTRAVENOUS

## 2020-07-11 NOTE — Progress Notes (Signed)
Hi Tanner Lewis,     The recent lab results show the following:   A1C (3 month measure of glucose control ) is elevated at prediabetic levels. Please continue to use diet and exercise, low carb to manage this,   CMP (comprehensive metabolic panel): liver function, kidney function, electrolytes, and glucose are within acceptable limits.  Lipid panel: total cholesterol, LDL (bad), triglycerides, and HDL (good) are within acceptable limits.

## 2020-07-16 ENCOUNTER — Encounter: Payer: Self-pay | Admitting: Hematology & Oncology

## 2020-07-17 ENCOUNTER — Other Ambulatory Visit: Payer: Self-pay | Admitting: Hematology & Oncology

## 2020-07-17 DIAGNOSIS — C61 Malignant neoplasm of prostate: Secondary | ICD-10-CM

## 2020-07-23 ENCOUNTER — Ambulatory Visit: Payer: Medicare Other | Attending: Hematology & Oncology | Admitting: Hematology & Oncology

## 2020-07-23 ENCOUNTER — Encounter: Payer: Self-pay | Admitting: Hematology & Oncology

## 2020-07-23 ENCOUNTER — Ambulatory Visit: Payer: Medicare Other

## 2020-07-23 ENCOUNTER — Other Ambulatory Visit: Payer: Medicare Other

## 2020-07-23 VITALS — BP 133/76 | HR 66 | Temp 98.2°F | Resp 16 | Ht 67.0 in | Wt 159.9 lb

## 2020-07-23 VITALS — BP 117/75 | HR 85 | Temp 97.8°F

## 2020-07-23 DIAGNOSIS — N398 Other specified disorders of urinary system: Secondary | ICD-10-CM | POA: Insufficient documentation

## 2020-07-23 DIAGNOSIS — C61 Malignant neoplasm of prostate: Secondary | ICD-10-CM | POA: Insufficient documentation

## 2020-07-23 DIAGNOSIS — C772 Secondary and unspecified malignant neoplasm of intra-abdominal lymph nodes: Secondary | ICD-10-CM | POA: Insufficient documentation

## 2020-07-23 DIAGNOSIS — Z5111 Encounter for antineoplastic chemotherapy: Secondary | ICD-10-CM | POA: Insufficient documentation

## 2020-07-23 DIAGNOSIS — R399 Unspecified symptoms and signs involving the genitourinary system: Secondary | ICD-10-CM

## 2020-07-23 DIAGNOSIS — Z79818 Long term (current) use of other agents affecting estrogen receptors and estrogen levels: Secondary | ICD-10-CM | POA: Insufficient documentation

## 2020-07-23 DIAGNOSIS — M8580 Other specified disorders of bone density and structure, unspecified site: Secondary | ICD-10-CM

## 2020-07-23 LAB — COMPREHENSIVE METABOLIC PANEL
ALT: 27 U/L (ref 0–55)
AST (SGOT): 31 U/L (ref 5–34)
Albumin/Globulin Ratio: 1 (ref 0.9–2.2)
Albumin: 4 g/dL (ref 3.5–5.0)
Alkaline Phosphatase: 46 U/L (ref 37–117)
Anion Gap: 0 — ABNORMAL LOW (ref 5.0–15.0)
BUN: 22 mg/dL (ref 9.0–28.0)
Bilirubin, Total: 0.9 mg/dL (ref 0.2–1.2)
CO2: 34 mEq/L — ABNORMAL HIGH (ref 21–29)
Calcium: 10.2 mg/dL (ref 7.9–10.2)
Chloride: 102 mEq/L (ref 100–111)
Creatinine: 1.6 mg/dL — ABNORMAL HIGH (ref 0.5–1.5)
Globulin: 4.1 g/dL — ABNORMAL HIGH (ref 2.0–3.7)
Glucose: 104 mg/dL — ABNORMAL HIGH (ref 70–100)
Potassium: 4.5 mEq/L (ref 3.5–5.1)
Protein, Total: 8.1 g/dL (ref 6.0–8.3)
Sodium: 136 mEq/L (ref 136–145)

## 2020-07-23 LAB — PSA, TOTAL AND FREE
PSA Free and Total Ratio: 0.182
PSA, Free: 1.1 ng/mL — ABNORMAL HIGH (ref 0.000–0.500)
Prostate Specific Antigen, Total: 6.048 ng/mL — ABNORMAL HIGH (ref 0.000–4.000)

## 2020-07-23 LAB — GFR: EGFR: 50.6

## 2020-07-23 LAB — HEMOLYSIS INDEX: Hemolysis Index: 12 (ref 0–24)

## 2020-07-23 MED ORDER — LEUPROLIDE ACETATE (6 MONTH) 45 MG SC KIT
45.0000 mg | PACK | Freq: Once | SUBCUTANEOUS | Status: AC
Start: 2020-07-23 — End: 2020-07-23
  Administered 2020-07-23: 14:00:00 45 mg via SUBCUTANEOUS
  Filled 2020-07-23: qty 45

## 2020-07-23 NOTE — Progress Notes (Signed)
[facility] ONCOL[facility]Y FOLLOW-UP NOTE      Date of service: July 23, 2020    Subjective:   Chief complaint: Tanner Lewis is a 80 y.o. year old male who returns for follow-up of metastatic prostate cancer.     History of Present Illness:  [name] presents for routine follow-up and Eligard today.  He was due for Eligard last month, but had to postpone his treatment due to testing positive for COVID. Other than some fatigue, he has been asymptomatic. He has mild, occasional hot flashes but has otherwise tolerated Eligard well.  He denies any new complaints. Specifically, no reucrrent syncopal episodes or falls. His nocturia remains significant, but stable. He denies any bone pain, shortness of breath, or chest pain. He is accompanied by his daughter today.       Oncology History:     Oncology History Overview Note   Adenocarcinoma of the prostate, stage IV (TX N1 M1a)  . Original diagnosis in 2006. Initial PSA was 6.96 ng/ml.   . Prostate biopsy on 02/18/05 confirmed a prostatic adenocarcinoma, Gleason 3+3=6, associated high-grade PIN.  . S/P 125-I seed implant on 04/25/2005.  Tanner Lewis Developed biochemical recurrence, treated with ADT (Casodex/Lupron) from Dec 2011 to March 2013.   Tanner Lewis PSA 2.22 ng/ml on 09/09/2014.  Tanner Lewis Bone scan on 10/08/14 was negative for e/o bone metastases.   . CT abd/pel on 10/08/14 showed "stable bilateral adrenal nodules and multiple prominent retroperitoneal adenopathy with the largest measuring 1.6 x 1.1 cm in the right iliac chain. There was also a new enlarged lymph node along the right pelvic sidewall measuring 1.4 x 1 cm. The retroperitoneal lymph nodes appear to be stable in appearance. The max dimension of the largest lymph node is 1.6 x 0.8 cm.   . MRI prostate on 11/13/14 demonstrated a local recurrence in the left lobe of the prostate measuring 2.6 x 1.5 x 1.6 cm with deformation of the prostate capsule and likely extracapsular spread. There was a 1.5 x 1.2 cm right iliac chain lymph node,  a 1.3 x 1.3 cm common iliac chain lymph node, additional smaller right iliac chain lymph nodes and right common iliac chain lymph nodes.   . Began ADT with Lupron q6 mo again on 11/25/2014.   Tanner Lewis PSA on 11/25/14 was 33.67 ng/ml; 2.18 ng/ml on 02/05/15; 1.65 in Nov 2016; 1.34 on 05/30/15.   Tanner Lewis Patient was lost to follow-up and did not receive another dose of Lupron since June 2016.  Tanner Lewis CT chest on 07/24/15 negative for e/o metastatic disease.   . Lupron was resumed on 12/21/16 and stopped again sometime in 2019.   Tanner Lewis PSA 1.62 on 06/08/17; 1.4 ng/ml on 06/20/17;   . Established care with local PCP on 05/11/2019 and a PSA was found to be 3.217 ng/ml. Referred to urology for urinary frequency.   . Renal US on 08/08/2019 was unremarkable.  . Establish care with med oncology on 08/16/2019. PSA of 10.51 ng/ml on 08/17/2019.   . CT c/a/p on 10/22/2019 showed a borderline enlarged AP window node which measures 1.4 x 1.4 cm. Indeterminate enhancing lesion in the right hepatic dome measures 1.9 x 1.7 cm. Indeterminate bilateral adrenal masses, 2.9 x 1.4 cm on right and 1.8 x 1.2 cm on left. Bilateral renal cysts noted, including a complex cyst in the upper pole right kidney which measures 4.2 x 3.0 cm, containing either internal septation or internal vessel. There is a more simple appearing cyst in the mid left  kidney which measures 4.9 x 4.3 cm. Bladder wall mildly thickened. Multiple prostatic radiation seeds noted. Unremarkable seminal vesicles. There is bulky abdominal lymphadenopathy. A left periaortic lymph node measures 2.7 x 2.2 cm. Retroaortic lymph node just below the aortic bifurcation measures 2.3 x 2.0 cm. There is no pelvic lymphadenopathy. No bone lesions.   Tanner Lewis MRI abdomen w/wo contrast on 11/13/2019 showed right liver lesion c/w hemangioma. Bilateral adrenal nodules consistent with adenomas. Renal lesions c/w cysts. Retroperitoneal adenopathy is partially imaged. For example a left periaortic lymph node measuring 3.1 x 2.6  cm.  . S/P CT-guided biopsy of retroperitoneal lymph node on 11/21/2019. Pathology consistent with a metastatic poorly-differentiated adenocarcinoma. There is comedonecrosis that can be seen in Gleason pattern 5 prostatic adenocarcinoma or colorectal adenocarcinoma. Immunostains show that the neoplastic cells are positive for NKX-3.1 and CDX-2, negative for CK7 and CK20, confirming the diagnosis of metastatic prostatic adenocarcinoma.    Tanner Lewis PSA 33.24 ng/ml on 11/29/2019.   Tanner Lewis Resume ADT with Casodex --> Eligard q23mo by 12/14/2019.   Tanner Lewis PSA down to 6.99 on 01/01/2020.   Tanner Lewis Bone scan on 01/01/2020 was negative for e/o bone metastases.   . CT abdomen/pelvis on 02/21/2020. Hepatic dome mass: 2.3 x 2.1 cm (302:16), previously 1.9 x 1.7 cm. Right adrenal mass: 3.1 x 1.6 cm (302:46), previously 2.9 x 1.3 cm. Left adrenal mass: 2.0 x 1.3 cm (302:51), previously 1.8 x 1.2 cm. Left periaortic lymph node: 1.8 x 1.3 cm (302:70), previously 2.7 x 2.2 cm. Aortic bifurcation lymph node: 1.7 x 1.0 cm (302:86), previously 2.3 x 2.0 cm. Enhancing mass in hepatic dome appears slightly increased in size. Bilateral adrenal mass are seen also minimally increased in size from prior exam. Multiple complex cysts are seen in the both kidneys. Retroperitoneal lymph nodes are decreased in size.  Tanner Lewis PSA down to 3.2 on 02/29/2020.   . In Oct 2021, offered to start him on a secondary oral antiandrogen, but he declined. [name] out of town to [employer] for 3 months.   . CT c/a/p on 07/11/2020. Para-aortic lymph node: 1.1 x 1.0 cm, previously 1.7 x 1.3 cm. Left common iliac node: 1.6 x 0.5 cm, previously 1.7 x 0.9 cm. There is no suspicious pulmonary nodule, mass or consolidation. A 3 mm nodule in the lingula is stable. A 1.5 x 1.4 cm mildly enlarged AP window lymph node is without significant interval change, previously 1.4 x 1.4 cm. There is no new mediastinal, axillary or hilar lymphadenopathy. A 2.4 cm benign right hepatic hemangioma is stable. No suspicious hepatic  mass is noted. There are stable bilateral adrenal nodules, previously characterized as adenomas on MRI from 11/13/2019. A 1.1 x 1.0 cm para-aortic lymph node has slightly decreased in size, previously 1.3 x 1.1 cm. There is no new abdominal/pelvic lymphadenopathy. Prostate radiation seeds are noted. No aggressive osseous lesion is identified.   . Bone scan on 07/11/2020 was negative for e/o bone metastases.     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 Hypertension    .  Prostate cancer 2010       Patient Active Problem List   Diagnosis   . Essential hypertension   . History of prostate cancer   . Fatigue, unspecified type   . Keeps losing balance   . Age-related cataract of both eyes, unspecified age-related cataract type   . 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 Nocturia   . Weak urinary stream   . Rising PSA following treatment for malignant neoplasm of prostate   . Right renal mass   . Mass of both adrenal glands   . 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 Stage 3b chronic kidney disease       Medications:  Current Outpatient Medications   Medication Sig Dispense Refill   . amLODIPine-benazepril (LOTREL 5-10) 5-10 MG per capsule Take 1 capsule by mouth daily 90 capsule 1   . hydrALAZINE (APRESOLINE) 50 MG tablet Take 1 tablet (50 mg total) by mouth 3 (three) times daily Take 1 tablet by mouth three times daily 270 tablet 3   . isosorbide dinitrate (ISORDIL) 20 MG tablet Take 1 tablet (20 mg total) by mouth 2 (two) times daily 180 tablet 3    . Mirabegron ER (Myrbetriq) 50 MG Tablet SR 24 hr Take 1 tablet (50 mg total) by mouth daily 90 tablet 3   . Misc Natural Products (PROSTATE SUPPORT PO) Take by mouth Twice daily.     . simvastatin (ZOCOR) 20 MG tablet Take 1 tablet (20 mg total) by mouth nightly Take one tablet by mouth at bedtime 90 tablet 3   . venlafaxine (EFFEXOR) 75 MG tablet Take 1 tablet (75 mg total) by mouth daily 90 tablet 1     No current facility-administered medications for this visit.       Allergies, Social History, and Family History were reviewed and updated.      Tobacco Dependence:  Social History     Tobacco Use   Smoking Status Former Smoker   . Packs/day: 1.50   . Years: 20.00   . Pack years: 30.00   . Types: Cigarettes   . Quit date: 05/10/2014   . Years since quitting: 6.2   Smokeless Tobacco Never Used       Smoking Cessation Counseling:  Date: July 23, 2020  N/A    Physical Exam:  Vitals:    07/23/20 1304   BP: 133/76   Pulse: 66   Resp: 16   Temp: 98.2 F (36.8 C)   SpO2: 99%     Body surface area is 1.85 meters squared.  ECOG performance status: 0-1  Psychosocial: Appropriate affect. Supportive daughters. Ongoing concerns regarding memory loss and cognitive deficits.    General Appearance:  Alert, cooperative, in no distress, appears stated age  Head:  Normocephalic without obvious abnormalities, atraumatic  Eyes: Intact EOM. No scleral icterus, conjunctiva/corneas clear  Nose: Deferred, wearing mask  Throat:  Deferred, wearing mask  Neck: Supple, symmetrical, trachea midline  Lungs: Clear to auscultation bilaterally, no increased work of breathing or tachypnea  Heart: Regular rate and rhythm, S1 and S2 normal, no murmurs  Abdomen: Soft, non-tender, non-distended, bowel sounds normoactive  Extremities: No cyanosis or edema  Skin: Normal color and turgor, no rashes  Neurologic: Cranial nerves grossly intact, no focal motor deficits, normal speech and mentation      Labs:     Recent Results (from the  past 2688  hour(s))   Lipid panel    Collection Time: 07/10/20  9:48 AM   Result Value Ref Range    Cholesterol 127 0 - 199 mg/dL    Triglycerides 78 34 - 149 mg/dL    HDL 72 40 - 1,610 mg/dL    LDL Calculated 39 0 - 99 mg/dL    VLDL Calculated 16 10 - 40 mg/dL    Cholesterol / HDL Ratio 1.8 See Below   Hemoglobin A1C    Collection Time: 07/10/20  9:48 AM   Result Value Ref Range    Hemoglobin A1C 5.9 4.6 - 5.9 %    Average Estimated Glucose 122.6 mg/dL   Comprehensive metabolic panel    Collection Time: 07/10/20  9:48 AM   Result Value Ref Range    Glucose 107 (H) 70 - 100 mg/dL    BUN 96.0 9.0 - 45.4 mg/dL    Creatinine 1.4 0.5 - 1.5 mg/dL    Sodium 098 (L) 119 - 145 mEq/L    Potassium 4.2 3.5 - 5.1 mEq/L    Chloride 101 100 - 111 mEq/L    CO2 27 21 - 29 mEq/L    Calcium 9.3 7.9 - 10.2 mg/dL    Protein, Total 7.0 6.0 - 8.3 g/dL    Albumin 3.8 3.5 - 5.0 g/dL    AST (SGOT) 33 5 - 34 U/L    ALT 35 0 - 55 U/L    Alkaline Phosphatase 46 37 - 117 U/L    Bilirubin, Total 0.9 0.2 - 1.2 mg/dL    Globulin 3.2 2.0 - 3.7 g/dL    Albumin/Globulin Ratio 1.2 0.9 - 2.2    Anion Gap 6.0 5.0 - 15.0   GFR    Collection Time: 07/10/20  9:48 AM   Result Value Ref Range    EGFR 59.0    Hemolysis index    Collection Time: 07/10/20  9:48 AM   Result Value Ref Range    Hemolysis Index 2 0 - 24   Comprehensive metabolic panel    Collection Time: 07/23/20 12:58 PM   Result Value Ref Range    Glucose 104 (H) 70 - 100 mg/dL    BUN 14.7 9.0 - 82.9 mg/dL    Creatinine 1.6 (H) 0.5 - 1.5 mg/dL    Sodium 562 130 - 865 mEq/L    Potassium 4.5 3.5 - 5.1 mEq/L    Chloride 102 100 - 111 mEq/L    CO2 34 (H) 21 - 29 mEq/L    Calcium 10.2 7.9 - 10.2 mg/dL    Protein, Total 8.1 6.0 - 8.3 g/dL    Albumin 4.0 3.5 - 5.0 g/dL    AST (SGOT) 31 5 - 34 U/L    ALT 27 0 - 55 U/L    Alkaline Phosphatase 46 37 - 117 U/L    Bilirubin, Total 0.9 0.2 - 1.2 mg/dL    Globulin 4.1 (H) 2.0 - 3.7 g/dL    Albumin/Globulin Ratio 1.0 0.9 - 2.2    Anion Gap 0.0 (L) 5.0 - 15.0   GFR     Collection Time: 07/23/20 12:58 PM   Result Value Ref Range    EGFR 50.6    Hemolysis index    Collection Time: 07/23/20 12:58 PM   Result Value Ref Range    Hemolysis Index 12 0 - 24   .    Rads:     CT c/a/p on 07/11/2020. Para-aortic lymph node: 1.1 x  1.0 cm, previously 1.7 x 1.3 cm. Left common iliac node: 1.6 x 0.5 cm, previously 1.7 x 0.9 cm. There is no suspicious pulmonary nodule, mass or consolidation. A 3 mm nodule in the lingula is stable. A 1.5 x 1.4 cm mildly enlarged AP window lymph node is without significant interval change, previously 1.4 x 1.4 cm. There is no new mediastinal, axillary or hilar lymphadenopathy. A 2.4 cm benign right hepatic hemangioma is stable. No suspicious hepatic mass is noted. There are stable bilateral adrenal nodules, previously characterized as adenomas on MRI from 11/13/2019. A 1.1 x 1.0 cm para-aortic lymph node has slightly decreased in size, previously 1.3 x 1.1 cm. There is no new abdominal/pelvic lymphadenopathy. Prostate radiation seeds are noted. No aggressive osseous lesion is identified.     Bone scan on 07/11/2020 was negative for e/o bone metastases.    Assessment:     1. 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 Androgen deprivation therapy    3. Osteopenia, unspecified location    4. Lower urinary tract symptoms (LUTS)       Recommendations:     1. Adenocarcinoma of the prostate, stage IV (TX N1 M1a), castrate-sensitive. Oncologic history dating back to initial diagnosis in 2006 is summarized above. Prior to establishing care with me, he was receiving ADT intermittently for biochemical recurrence, then for nodal metastases. By the time I met him in March 2021 for a rising PSA level, he had been off ADT for at least 2 years and his PSA had risen up to 10.5 by 08/17/2019. CT c/a/p and MRI abdomen done in May/June 2021 were notable for retroperitoneal adenopathy. CT-guided retroperitoneal LN biopsy on 11/21/2019 confirmed diagnosis of metastatic  prostate cancer. Bone scan on 01/01/2020 was negative for e/o bone metastases.  ADT was resumed in July 2021 and he remains on q43mo Eligard now, scheduled for next dose today (postponed from January). On exam today, he remains clinically stable. His PSA has decreased from 33.2 in July 2021, down to 3.2 on 02/29/2020, consistent with response to ADT. Prior to him going out of town to [facility] for 3 months, I offered to start him on a secondary oral antiandrogen (Zytiga/prednisone), but he declined given his travel plans. In the interim, he had a restaging bone scan on 2/11 that was negative again for bone metastases and his repeat CT c/a/p demonstrated ongoing mild decrease in size of his retroperitoneal LN, consistent with response to ADT. 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  I again advised that we consider adding a secondary oral antiandrogen agent (abiraterone/pred may be favored over AR blocker such as enzalutamide given cognitive issues and history of falls/syncope), but he will be traveling to [place] again next month with plans to return in late April. We agreed to hold off on starting a new medication until he returns in late April.     2. Androgen deprivation therapy/Osteopenia. DEXA scan in May 2021 c/w osteopenia of the left femoral neck. Normal BMD in the spine. Continue Ca and vit D supplementation for bone health.     3. LUTS. Likely 2/2 overactive bladder. Followed by Drs. Wilson and Rice. Now on mirabegron and tamsulosin. S/P cystoscopy on 08/09/2019 which was unremarkable. Prostate gland not significantly enlarged. Continue follow-up with urology as needed.     4. Miscellaneous. He plans on spending half the year here and the other half in [place] where he has another daughter. He and his daughter here [name])  state that they intend on all keeping all medical appts and treatments here if possible, but I did advise them to consider establishing care with a medical oncologist in  [place] for the times he will be in [place].     5. Follow-up. We agreed for Mr. Abrahamsen to return in late April or early May for labs and follow-up. I encouraged the patient to call or message me via MyChart with any questions or concerns prior to their next appointment here. They had many relevant questions which I answered to the best of my ability and to their apparent satisfaction. They voiced understanding and agreed to the plan outlined above.       LOS Documentation:    Number and Complexity of Problems Addressed:  2 or more stable chronic illnesses    Amount and/or Complexity of Data Reviewed:  - Reviewed the following results from 2022 and 2021: 3+ unique labs and 2 unique imaging tests  - Ordered the following unique test(s): 3+ unique labs  - Performed an assessment requiring an independent historian - daughter    Risk of Complications and/or Morbidity or Mortality of Patient Management:  Prescription drug management        I spent a total of 45 minutes reviewing the patient's chart, including relevant radiological and laboratory findings, pathology reports, and available outside records, counseling, medical decision making, coordination of care, and documentation for today's encounter.    Disclaimer: This note was dictated with voice recognition software. Similar sounding words can inadvertently be transcribed and may not be corrected upon review.    Signed by:    Serena Croissant, MD  Medical Oncologist  [facility]  [address] [facility] 5th Floor  [place], [place] [address]  Tel: [phone]  Fax: [phone]     Ronald Lobo, MD  2/23/20221:35 PM

## 2020-07-23 NOTE — Progress Notes (Signed)
Time 1400    Tanner Lewis is a 80 y.o. male admitted to clinic for Eligard injection. Pt c/o no new issues states he is feeling well today.     Eligard 45mg  given SQ in LUE.  Pt tolerated injection without incident.    See MAR, VS, Doc Flowsheets for further information. Pt to call MD for problems, questions, concerns.    Pt discharged in NAD.    RTC 6months Aug 10    [name], RN, RN

## 2020-07-31 ENCOUNTER — Encounter (INDEPENDENT_AMBULATORY_CARE_PROVIDER_SITE_OTHER): Payer: Self-pay | Admitting: Family Medicine

## 2020-07-31 ENCOUNTER — Other Ambulatory Visit (INDEPENDENT_AMBULATORY_CARE_PROVIDER_SITE_OTHER): Payer: Self-pay | Admitting: Family Medicine

## 2020-07-31 DIAGNOSIS — R3912 Poor urinary stream: Secondary | ICD-10-CM

## 2020-07-31 NOTE — Telephone Encounter (Signed)
Patient is out of his medication       Name, strength, directions of requested refill(s):    tamsulosin (FLOMAX) 0.4 MG Cap         Pharmacy to send refill to or patient to pick up rx from office (mark requested pharmacy in BOLD):      [facility] [address] [place] - [address] [facility]  [address]  [place] [address]  Phone: [phone] Fax: [phone]    [facility] [address] [place] - [address]  [address]  [place] [address]  Phone: [phone] Fax: [phone]    [id number] [place], [place] - [phone] AT [facility] [address]  [place] [address]  Phone: [phone] Fax: [phone]        Please mark "X" next to the preferred call back number:    Mobile:   Telephone Information:   Mobile [phone]       Home: @HOMEPHONE @    Work: @WORKPHONE @          Next visit: Visit date not found

## 2020-07-31 NOTE — Telephone Encounter (Signed)
Medication: tamsulosin (FLOMAX) 0.4 MG Cap   Last filled: Unknown  Last Appointment: 07/10/20 Medicare Wellness, Prostate, HTN, Depression, DM  Last Labs: 07/23/20 - PSA    Component   Ref Range & Units 07/23/20 12:58 PM 02/29/20 12:25 PM   Prostate Specific Antigen, Total   0.000 - 4.000 ng/mL 6.048High  3.186 CM    PSA, Free   0.000 - 0.500 ng/mL 1.100High     PSA Free and Total Ratio  0.182     GFR 50.6    Next Appointment:

## 2020-08-01 ENCOUNTER — Other Ambulatory Visit (INDEPENDENT_AMBULATORY_CARE_PROVIDER_SITE_OTHER): Payer: Self-pay | Admitting: Family Medicine

## 2020-08-01 ENCOUNTER — Encounter (INDEPENDENT_AMBULATORY_CARE_PROVIDER_SITE_OTHER): Payer: Self-pay

## 2020-08-01 DIAGNOSIS — R3912 Poor urinary stream: Secondary | ICD-10-CM

## 2020-08-01 MED ORDER — TAMSULOSIN HCL 0.4 MG PO CAPS
0.4000 mg | ORAL_CAPSULE | Freq: Every day | ORAL | 1 refills | Status: DC
Start: 2020-08-01 — End: 2021-02-09

## 2020-08-18 ENCOUNTER — Other Ambulatory Visit (INDEPENDENT_AMBULATORY_CARE_PROVIDER_SITE_OTHER): Payer: Self-pay | Admitting: Family Medicine

## 2020-08-18 ENCOUNTER — Encounter (INDEPENDENT_AMBULATORY_CARE_PROVIDER_SITE_OTHER): Payer: Self-pay | Admitting: Family Medicine

## 2020-08-18 DIAGNOSIS — R42 Dizziness and giddiness: Secondary | ICD-10-CM

## 2020-08-19 MED ORDER — AMLODIPINE BESY-BENAZEPRIL HCL 5-10 MG PO CAPS
1.0000 | ORAL_CAPSULE | Freq: Every day | ORAL | 1 refills | Status: DC
Start: 2020-08-19 — End: 2021-03-19

## 2020-08-19 NOTE — Progress Notes (Signed)
Medication: amLODIPine-benazepril (LOTREL 5-10) 5-10 MG per capsule  Last filled: 02/11/20 with a 6 month supply   Last Appointment: 07/10/20 for medicare annual exam, CKD3, Depression, HLD, HTN, DM and Prostate issues.  Last Labs: 07/23/20  Next Appointment:

## 2020-08-26 ENCOUNTER — Encounter: Payer: Self-pay | Admitting: Hematology & Oncology

## 2020-10-08 ENCOUNTER — Encounter: Payer: Self-pay | Admitting: Hematology & Oncology

## 2020-10-08 ENCOUNTER — Ambulatory Visit: Payer: Medicare Other | Attending: Hematology & Oncology | Admitting: Hematology & Oncology

## 2020-10-08 ENCOUNTER — Other Ambulatory Visit (FREE_STANDING_LABORATORY_FACILITY): Payer: Medicare Other

## 2020-10-08 VITALS — BP 133/62 | HR 84 | Temp 97.2°F | Resp 12 | Ht 67.0 in | Wt 158.3 lb

## 2020-10-08 DIAGNOSIS — R399 Unspecified symptoms and signs involving the genitourinary system: Secondary | ICD-10-CM | POA: Insufficient documentation

## 2020-10-08 DIAGNOSIS — M8580 Other specified disorders of bone density and structure, unspecified site: Secondary | ICD-10-CM | POA: Insufficient documentation

## 2020-10-08 DIAGNOSIS — C61 Malignant neoplasm of prostate: Secondary | ICD-10-CM | POA: Insufficient documentation

## 2020-10-08 DIAGNOSIS — Z191 Hormone sensitive malignancy status: Secondary | ICD-10-CM | POA: Insufficient documentation

## 2020-10-08 DIAGNOSIS — Z79818 Long term (current) use of other agents affecting estrogen receptors and estrogen levels: Secondary | ICD-10-CM | POA: Insufficient documentation

## 2020-10-08 DIAGNOSIS — C772 Secondary and unspecified malignant neoplasm of intra-abdominal lymph nodes: Secondary | ICD-10-CM

## 2020-10-08 LAB — COMPREHENSIVE METABOLIC PANEL
ALT: 23 U/L (ref 0–55)
AST (SGOT): 32 U/L (ref 5–34)
Albumin/Globulin Ratio: 1 (ref 0.9–2.2)
Albumin: 3.8 g/dL (ref 3.5–5.0)
Alkaline Phosphatase: 41 U/L (ref 37–117)
Anion Gap: 7 (ref 5.0–15.0)
BUN: 23 mg/dL (ref 9.0–28.0)
Bilirubin, Total: 0.7 mg/dL (ref 0.2–1.2)
CO2: 27 mEq/L (ref 21–29)
Calcium: 9.9 mg/dL (ref 7.9–10.2)
Chloride: 105 mEq/L (ref 100–111)
Creatinine: 1.6 mg/dL — ABNORMAL HIGH (ref 0.5–1.5)
Globulin: 3.7 g/dL — ABNORMAL HIGH (ref 2.0–3.6)
Glucose: 110 mg/dL — ABNORMAL HIGH (ref 70–100)
Potassium: 4.2 mEq/L (ref 3.5–5.1)
Protein, Total: 7.5 g/dL (ref 6.0–8.3)
Sodium: 139 mEq/L (ref 136–145)

## 2020-10-08 LAB — CBC AND DIFFERENTIAL
Absolute NRBC: 0 10*3/uL (ref 0.00–0.00)
Basophils Absolute Automated: 0.02 10*3/uL (ref 0.00–0.08)
Basophils Automated: 0.4 %
Eosinophils Absolute Automated: 0.08 10*3/uL (ref 0.00–0.44)
Eosinophils Automated: 1.7 %
Hematocrit: 35.8 % — ABNORMAL LOW (ref 37.6–49.6)
Hgb: 12 g/dL — ABNORMAL LOW (ref 12.5–17.1)
Immature Granulocytes Absolute: 0.01 10*3/uL (ref 0.00–0.07)
Immature Granulocytes: 0.2 %
Lymphocytes Absolute Automated: 1.49 10*3/uL (ref 0.42–3.22)
Lymphocytes Automated: 32.2 %
MCH: 30.7 pg (ref 25.1–33.5)
MCHC: 33.5 g/dL (ref 31.5–35.8)
MCV: 91.6 fL (ref 78.0–96.0)
MPV: 8.9 fL (ref 8.9–12.5)
Monocytes Absolute Automated: 0.53 10*3/uL (ref 0.21–0.85)
Monocytes: 11.4 %
Neutrophils Absolute: 2.5 10*3/uL (ref 1.10–6.33)
Neutrophils: 54.1 %
Nucleated RBC: 0 /100 WBC (ref 0.0–0.0)
Platelets: 208 10*3/uL (ref 142–346)
RBC: 3.91 10*6/uL — ABNORMAL LOW (ref 4.20–5.90)
RDW: 13 % (ref 11–15)
WBC: 4.63 10*3/uL (ref 3.10–9.50)

## 2020-10-08 LAB — TESTOSTERONE: Testosterone: 13 ng/dL — ABNORMAL LOW (ref 241–827)

## 2020-10-08 LAB — PSA: Prostate Specific Antigen, Total: 7.947 ng/mL — ABNORMAL HIGH (ref 0.000–4.000)

## 2020-10-08 LAB — HEMOLYSIS INDEX: Hemolysis Index: 5 (ref 0–24)

## 2020-10-08 LAB — GFR: EGFR: 50.6

## 2020-10-08 NOTE — Progress Notes (Signed)
[facility] ONCOL[facility]Y FOLLOW-UP NOTE      Date of service: Oct 08, 2020    Subjective:   Chief complaint: Tanner Lewis is a 80 y.o. year old male who returns for follow-up of metastatic prostate cancer.     History of Present Illness:  Tanner Lewis presents for routine follow-up.  He remains on Eligard q22mo and continues to tolerate it well. He has chronic but stable fatigue and mild hot flashes, otherwise denies any pain and remain independent with his ADLs. His appetite is lower compared to before, per his daughter, but weight is generally stable. He is accompanied by his daughter today.       Oncology History:     Oncology History Overview Note   Adenocarcinoma of the prostate, stage IV (TX N1 M1a)  . Original diagnosis in 2006. Initial PSA was 6.96 ng/ml.   . Prostate biopsy on 02/18/05 confirmed a prostatic adenocarcinoma, Gleason 3+3=6, associated high-grade PIN.  . S/P 125-I seed implant on 04/25/2005.  [name] Developed biochemical recurrence, treated with ADT (Casodex/Lupron) from Dec 2011 to March 2013.   [name] PSA 2.22 ng/ml on 09/09/2014.  [name] Bone scan on 10/08/14 was negative for e/o bone metastases.   . CT abd/pel on 10/08/14 showed "stable bilateral adrenal nodules and multiple prominent retroperitoneal adenopathy with the largest measuring 1.6 x 1.1 cm in the right iliac chain. There was also a new enlarged lymph node along the right pelvic sidewall measuring 1.4 x 1 cm. The retroperitoneal lymph nodes appear to be stable in appearance. The max dimension of the largest lymph node is 1.6 x 0.8 cm.   . MRI prostate on 11/13/14 demonstrated a local recurrence in the left lobe of the prostate measuring 2.6 x 1.5 x 1.6 cm with deformation of the prostate capsule and likely extracapsular spread. There was a 1.5 x 1.2 cm right iliac chain lymph node, a 1.3 x 1.3 cm common iliac chain lymph node, additional smaller right iliac chain lymph nodes and right common iliac chain lymph nodes.   . Began ADT with Lupron q6 mo again  on 11/25/2014.   [name] PSA on 11/25/14 was 33.67 ng/ml; 2.18 ng/ml on 02/05/15; 1.65 in Nov 2016; 1.34 on 05/30/15.   [name] Patient was lost to follow-up and did not receive another dose of Lupron since June 2016.  [name] CT chest on 07/24/15 negative for e/o metastatic disease.   . Lupron was resumed on 12/21/16 and stopped again sometime in 2019.   [name] PSA 1.62 on 06/08/17; 1.4 ng/ml on 06/20/17;   . Established care with local PCP on 05/11/2019 and a PSA was found to be 3.217 ng/ml. Referred to urology for urinary frequency.   . Renal US on 08/08/2019 was unremarkable.  . Establish care with med oncology on 08/16/2019. PSA of 10.51 ng/ml on 08/17/2019.   . CT c/a/p on 10/22/2019 showed a borderline enlarged AP window node which measures 1.4 x 1.4 cm. Indeterminate enhancing lesion in the right hepatic dome measures 1.9 x 1.7 cm. Indeterminate bilateral adrenal masses, 2.9 x 1.4 cm on right and 1.8 x 1.2 cm on left. Bilateral renal cysts noted, including a complex cyst in the upper pole right kidney which measures 4.2 x 3.0 cm, containing either internal septation or internal vessel. There is a more simple appearing cyst in the mid left kidney which measures 4.9 x 4.3 cm. Bladder wall mildly thickened. Multiple prostatic radiation seeds noted. Unremarkable seminal vesicles. There is bulky abdominal lymphadenopathy. A left  periaortic lymph node measures 2.7 x 2.2 cm. Retroaortic lymph node just below the aortic bifurcation measures 2.3 x 2.0 cm. There is no pelvic lymphadenopathy. No bone lesions.   [name] MRI abdomen w/wo contrast on 11/13/2019 showed right liver lesion c/w hemangioma. Bilateral adrenal nodules consistent with adenomas. Renal lesions c/w cysts. Retroperitoneal adenopathy is partially imaged. For example a left periaortic lymph node measuring 3.1 x 2.6 cm.  . S/P CT-guided biopsy of retroperitoneal lymph node on 11/21/2019. Pathology consistent with a metastatic poorly-differentiated adenocarcinoma. There is comedonecrosis that can  be seen in Gleason pattern 5 prostatic adenocarcinoma or colorectal adenocarcinoma. Immunostains show that the neoplastic cells are positive for NKX-3.1 and CDX-2, negative for CK7 and CK20, confirming the diagnosis of metastatic prostatic adenocarcinoma.    [name] PSA 33.24 ng/ml on 11/29/2019.   [name] Resume ADT with Casodex --> Eligard q47mo by 12/14/2019.   [name] PSA down to 6.99 on 01/01/2020.   [name] Bone scan on 01/01/2020 was negative for e/o bone metastases.   . CT abdomen/pelvis on 02/21/2020. Hepatic dome mass: 2.3 x 2.1 cm (302:16), previously 1.9 x 1.7 cm. Right adrenal mass: 3.1 x 1.6 cm (302:46), previously 2.9 x 1.3 cm. Left adrenal mass: 2.0 x 1.3 cm (302:51), previously 1.8 x 1.2 cm. Left periaortic lymph node: 1.8 x 1.3 cm (302:70), previously 2.7 x 2.2 cm. Aortic bifurcation lymph node: 1.7 x 1.0 cm (302:86), previously 2.3 x 2.0 cm. Enhancing mass in hepatic dome appears slightly increased in size. Bilateral adrenal mass are seen also minimally increased in size from prior exam. Multiple complex cysts are seen in the both kidneys. Retroperitoneal lymph nodes are decreased in size.  [name] PSA down to 3.2 on 02/29/2020.   . In Oct 2021, offered to start him on a secondary oral antiandrogen, but he declined. [name] out of town to [employer] for 3 months.   . CT c/a/p on 07/11/2020. Para-aortic lymph node: 1.1 x 1.0 cm, previously 1.7 x 1.3 cm. Left common iliac node: 1.6 x 0.5 cm, previously 1.7 x 0.9 cm. There is no suspicious pulmonary nodule, mass or consolidation. A 3 mm nodule in the lingula is stable. A 1.5 x 1.4 cm mildly enlarged AP window lymph node is without significant interval change, previously 1.4 x 1.4 cm. There is no new mediastinal, axillary or hilar lymphadenopathy. A 2.4 cm benign right hepatic hemangioma is stable. No suspicious hepatic mass is noted. There are stable bilateral adrenal nodules, previously characterized as adenomas on MRI from 11/13/2019. A 1.1 x 1.0 cm para-aortic lymph node has slightly decreased in  size, previously 1.3 x 1.1 cm. There is no new abdominal/pelvic lymphadenopathy. Prostate radiation seeds are noted. No aggressive osseous lesion is identified.   . Bone scan on 07/11/2020 was negative for e/o bone metastases.  . PSA up to 6.05 on 07/23/2020. 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 Hypertension    . Prostate cancer 2010  Patient Active Problem List   Diagnosis   . Essential hypertension   . History of prostate cancer   . Fatigue, unspecified type   . Keeps losing balance   . Age-related cataract of both eyes, unspecified age-related cataract type   . 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 Nocturia   . Weak urinary stream   . Rising PSA following treatment for malignant neoplasm of prostate   . Right renal mass   . Mass of both adrenal glands   . 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 Stage 3b chronic kidney disease       Medications:  Current Outpatient Medications   Medication Sig Dispense Refill   . amLODIPine-benazepril (LOTREL 5-10) 5-10 MG per capsule Take 1 capsule by mouth daily 90 capsule 1   . hydrALAZINE (APRESOLINE) 50 MG tablet Take 1 tablet (50 mg total) by mouth 3 (three) times daily Take 1 tablet by mouth three times daily 270 tablet 3   . isosorbide dinitrate (ISORDIL) 20 MG tablet Take 1 tablet (20 mg total) by mouth 2 (two) times daily 180 tablet 3   . Mirabegron ER (Myrbetriq) 50 MG Tablet SR 24 hr Take 1 tablet (50 mg total) by mouth  daily 90 tablet 3   . simvastatin (ZOCOR) 20 MG tablet Take 1 tablet (20 mg total) by mouth nightly Take one tablet by mouth at bedtime 90 tablet 3   . tamsulosin (FLOMAX) 0.4 MG Cap Take 1 capsule (0.4 mg total) by mouth Daily after dinner Take 1 capsule by mouth daily. 90 capsule 1   . venlafaxine (EFFEXOR) 75 MG tablet Take 1 tablet (75 mg total) by mouth daily 90 tablet 1   . Misc Natural Products (PROSTATE SUPPORT PO) Take by mouth Twice daily.       No current facility-administered medications for this visit.       Allergies, Social History, and Family History were reviewed and updated.      Tobacco Dependence:  Social History     Tobacco Use   Smoking Status Former Smoker   . Packs/day: 1.50   . Years: 20.00   . Pack years: 30.00   . Types: Cigarettes   . Quit date: 05/10/2014   . Years since quitting: 6.4   Smokeless Tobacco Never Used       Smoking Cessation Counseling:  Date: Oct 08, 2020  N/A    Physical Exam:  Vitals:    10/08/20 0905   BP: 133/62   Pulse: 84   Resp: 12   Temp: 97.2 F (36.2 C)   SpO2: 98%     Body surface area is 1.84 meters squared.  ECOG performance status: 0-1  Psychosocial: Appropriate affect. Supportive daughters. Ongoing concerns regarding memory loss and cognitive deficits.    General Appearance:  Alert, cooperative, in no distress, appears stated age  Head:  Normocephalic without obvious abnormalities, atraumatic  Eyes: Intact EOM. No scleral icterus, conjunctiva/corneas clear  Nose: Deferred, wearing mask  Throat:  Deferred, wearing mask  Neck: Supple, symmetrical, trachea midline  Lungs: Clear to auscultation bilaterally, no increased work of breathing or tachypnea  Heart: Regular rate and rhythm, S1 and S2 normal, no murmurs  Abdomen: Soft, non-tender, non-distended, bowel sounds normoactive. No palpable masses or organomegaly  Extremities: No cyanosis or edema  Skin: Normal color and turgor, no rashes  Neurologic: Cranial nerves  grossly intact, no focal motor deficits,  normal speech and mentation. Stable gait      Labs:     Recent Results (from the past 2688 hour(s))   Lipid panel    Collection Time: 07/10/20  9:48 AM   Result Value Ref Range    Cholesterol 127 0 - 199 mg/dL    Triglycerides 78 34 - 149 mg/dL    HDL 72 40 - 1,610 mg/dL    LDL Calculated 39 0 - 99 mg/dL    VLDL Calculated 16 10 - 40 mg/dL    Cholesterol / HDL Ratio 1.8 See Below   Hemoglobin A1C    Collection Time: 07/10/20  9:48 AM   Result Value Ref Range    Hemoglobin A1C 5.9 4.6 - 5.9 %    Average Estimated Glucose 122.6 mg/dL   Comprehensive metabolic panel    Collection Time: 07/10/20  9:48 AM   Result Value Ref Range    Glucose 107 (H) 70 - 100 mg/dL    BUN 96.0 9.0 - 45.4 mg/dL    Creatinine 1.4 0.5 - 1.5 mg/dL    Sodium 098 (L) 119 - 145 mEq/L    Potassium 4.2 3.5 - 5.1 mEq/L    Chloride 101 100 - 111 mEq/L    CO2 27 21 - 29 mEq/L    Calcium 9.3 7.9 - 10.2 mg/dL    Protein, Total 7.0 6.0 - 8.3 g/dL    Albumin 3.8 3.5 - 5.0 g/dL    AST (SGOT) 33 5 - 34 U/L    ALT 35 0 - 55 U/L    Alkaline Phosphatase 46 37 - 117 U/L    Bilirubin, Total 0.9 0.2 - 1.2 mg/dL    Globulin 3.2 2.0 - 3.7 g/dL    Albumin/Globulin Ratio 1.2 0.9 - 2.2    Anion Gap 6.0 5.0 - 15.0   GFR    Collection Time: 07/10/20  9:48 AM   Result Value Ref Range    EGFR 59.0    Hemolysis index    Collection Time: 07/10/20  9:48 AM   Result Value Ref Range    Hemolysis Index 2 0 - 24   Comprehensive metabolic panel    Collection Time: 07/23/20 12:58 PM   Result Value Ref Range    Glucose 104 (H) 70 - 100 mg/dL    BUN 14.7 9.0 - 82.9 mg/dL    Creatinine 1.6 (H) 0.5 - 1.5 mg/dL    Sodium 562 130 - 865 mEq/L    Potassium 4.5 3.5 - 5.1 mEq/L    Chloride 102 100 - 111 mEq/L    CO2 34 (H) 21 - 29 mEq/L    Calcium 10.2 7.9 - 10.2 mg/dL    Protein, Total 8.1 6.0 - 8.3 g/dL    Albumin 4.0 3.5 - 5.0 g/dL    AST (SGOT) 31 5 - 34 U/L    ALT 27 0 - 55 U/L    Alkaline Phosphatase 46 37 - 117 U/L    Bilirubin, Total 0.9 0.2 - 1.2 mg/dL    Globulin 4.1 (H) 2.0 - 3.7  g/dL    Albumin/Globulin Ratio 1.0 0.9 - 2.2    Anion Gap 0.0 (L) 5.0 - 15.0   PSA, total and free    Collection Time: 07/23/20 12:58 PM   Result Value Ref Range    Prostate Specific Antigen, Total 6.048 (H) 0.000 - 4.000 ng/mL    PSA, Free 1.100 (H) 0.000 - 0.500 ng/mL  PSA Free and Total Ratio 0.182    GFR    Collection Time: 07/23/20 12:58 PM   Result Value Ref Range    EGFR 50.6    Hemolysis index    Collection Time: 07/23/20 12:58 PM   Result Value Ref Range    Hemolysis Index 12 0 - 24   CBC and differential    Collection Time: 10/08/20  8:46 AM   Result Value Ref Range    WBC 4.63 3.10 - 9.50 x10 3/uL    Hgb 12.0 (L) 12.5 - 17.1 g/dL    Hematocrit 46.9 (L) 37.6 - 49.6 %    Platelets 208 142 - 346 x10 3/uL    RBC 3.91 (L) 4.20 - 5.90 x10 6/uL    MCV 91.6 78.0 - 96.0 fL    MCH 30.7 25.1 - 33.5 pg    MCHC 33.5 31.5 - 35.8 g/dL    RDW 13 11 - 15 %    MPV 8.9 8.9 - 12.5 fL    Neutrophils 54.1 None %    Lymphocytes Automated 32.2 None %    Monocytes 11.4 None %    Eosinophils Automated 1.7 None %    Basophils Automated 0.4 None %    Immature Granulocytes 0.2 None %    Nucleated RBC 0.0 0.0 - 0.0 /100 WBC    Neutrophils Absolute 2.50 1.10 - 6.33 x10 3/uL    Lymphocytes Absolute Automated 1.49 0.42 - 3.22 x10 3/uL    Monocytes Absolute Automated 0.53 0.21 - 0.85 x10 3/uL    Eosinophils Absolute Automated 0.08 0.00 - 0.44 x10 3/uL    Basophils Absolute Automated 0.02 0.00 - 0.08 x10 3/uL    Immature Granulocytes Absolute 0.01 0.00 - 0.07 x10 3/uL    Absolute NRBC 0.00 0.00 - 0.00 x10 3/uL   Comprehensive metabolic panel    Collection Time: 10/08/20  8:46 AM   Result Value Ref Range    Glucose 110 (H) 70 - 100 mg/dL    BUN 62.9 9.0 - 52.8 mg/dL    Creatinine 1.6 (H) 0.5 - 1.5 mg/dL    Sodium 413 244 - 010 mEq/L    Potassium 4.2 3.5 - 5.1 mEq/L    Chloride 105 100 - 111 mEq/L    CO2 27 21 - 29 mEq/L    Calcium 9.9 7.9 - 10.2 mg/dL    Protein, Total 7.5 6.0 - 8.3 g/dL    Albumin 3.8 3.5 - 5.0 g/dL    AST (SGOT) 32 5 -  34 U/L    ALT 23 0 - 55 U/L    Alkaline Phosphatase 41 37 - 117 U/L    Bilirubin, Total 0.7 0.2 - 1.2 mg/dL    Globulin 3.7 (H) 2.0 - 3.6 g/dL    Albumin/Globulin Ratio 1.0 0.9 - 2.2    Anion Gap 7.0 5.0 - 15.0   GFR    Collection Time: 10/08/20  8:46 AM   Result Value Ref Range    EGFR 50.6    Hemolysis index    Collection Time: 10/08/20  8:46 AM   Result Value Ref Range    Hemolysis Index 5 0 - 24   .    Rads:     No new imaging since last office visit.    Assessment:     1. Metastatic castration-sensitive adenocarcinoma of prostate    2. Androgen deprivation therapy    3. Osteopenia, unspecified location    4. Lower urinary tract symptoms (LUTS)  Recommendations:     1. Adenocarcinoma of the prostate, stage IV (TX N1 M1a), castrate-sensitive. Oncologic history dating back to initial diagnosis in 2006 is summarized above. Prior to establishing care with me, he was receiving ADT intermittently for biochemical recurrence, then for nodal metastases. By the time I met him in March 2021 for a rising PSA level, he had been off ADT for at least 2 years and his PSA had risen up to 10.5 by 08/17/2019. CT c/a/p and MRI abdomen done in May/June 2021 were notable for retroperitoneal adenopathy. CT-guided retroperitoneal LN biopsy on 11/21/2019 confirmed diagnosis of metastatic prostate cancer. Bone scan on 01/01/2020 was negative for e/o bone metastases.  ADT was resumed in July 2021 and he remains on q55mo Eligard now (due again in Aug 2022). He appears clinically stable on exam today. His PSA decreased from 33.2 in July 2021, down to 3.2 on 02/29/2020, consistent with response to ADT. His last PSA was up to 6 ng/ml in Feb 2022, possibly because he was a month overdue for his last dose of Eligard (d/t contracting COVID in Jan), but repeat labs are pending today. I had previously offered to start him on a secondary oral antiandrogen (Zytiga/prednisone), but he declined given his travel plans (lives part-time in [place]). His  last restaging bone scan on 2/11 was negative again for bone metastases and his CT c/a/p demonstrated ongoing mild decrease in size of his retroperitoneal LN, consistent with response to ADT. 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 We agreed on restaging scans in the next month after which we'll discuss adding a SAA. He and his daughters are open to coming in on a monthly basis now for treatment of his prostate cancer.     2. Androgen deprivation therapy/Osteopenia. DEXA scan in May 2021 c/w osteopenia of the left femoral neck. Normal BMD in the spine. Continue Ca and vit D supplementation for bone health.     3. LUTS. Likely 2/2 overactive bladder. Followed by Drs. Wilson and Rice. Now on mirabegron and tamsulosin. S/P cystoscopy on 08/09/2019 which was unremarkable. Prostate gland not significantly enlarged. Continue follow-up with urology as needed.     4. Miscellaneous. He plans on spending half the year here and the other half in [place] where he has another daughter. He and his daughter here [name]) state that they intend on all keeping all medical appts and treatments here if possible, but I have advised them to consider establishing care with a medical oncologist in [place] for the times he will be in [place].     5. Follow-up. We agreed for Tanner Lewis to return in a month for labs and follow-up. I encouraged the patient to call or message me via MyChart with any questions or concerns prior to their next appointment here. They had many relevant questions which I answered to the best of my ability and to their apparent satisfaction. They voiced understanding and agreed to the plan outlined above.       LOS Documentation:    Number and Complexity of Problems Addressed:  2 or more stable chronic illnesses    Amount and/or Complexity of Data Reviewed:  - Reviewed the following results from 2022: 3+ unique labs  - Ordered the following unique test(s): 3+ unique labs and 2 unique imaging  tests  - Performed an assessment requiring an independent historian - daughter    Risk of Complications and/or Morbidity or Mortality of Patient Management:  Prescription drug management  I spent a total of 40 minutes reviewing the patient's chart, including relevant radiological and laboratory findings, pathology reports, and available outside records, counseling, medical decision making, coordination of care, and documentation for today's encounter.    Disclaimer: This note was dictated with voice recognition software. Similar sounding words can inadvertently be transcribed and may not be corrected upon review.    Signed by:    Serena Croissant, MD  Medical Oncologist  [facility]  [address] [facility] 5th [address]  [place], [place] [address]  Tel: [phone]  Fax: [phone]     Ronald Lobo, MD  5/11/20229:23 AM

## 2020-11-13 ENCOUNTER — Ambulatory Visit
Admission: RE | Admit: 2020-11-13 | Discharge: 2020-11-13 | Disposition: A | Discharge status: Home / Self Care | Payer: Medicare Other | Source: Ambulatory Visit | Attending: Hematology & Oncology | Admitting: Hematology & Oncology

## 2020-11-13 ENCOUNTER — Other Ambulatory Visit (FREE_STANDING_LABORATORY_FACILITY): Payer: Medicare Other

## 2020-11-13 DIAGNOSIS — Z79818 Long term (current) use of other agents affecting estrogen receptors and estrogen levels: Secondary | ICD-10-CM

## 2020-11-13 DIAGNOSIS — C61 Malignant neoplasm of prostate: Secondary | ICD-10-CM | POA: Insufficient documentation

## 2020-11-13 DIAGNOSIS — R590 Localized enlarged lymph nodes: Secondary | ICD-10-CM | POA: Insufficient documentation

## 2020-11-13 DIAGNOSIS — Z191 Hormone sensitive malignancy status: Secondary | ICD-10-CM

## 2020-11-13 LAB — CBC AND DIFFERENTIAL
Absolute NRBC: 0 10*3/uL (ref 0.00–0.00)
Basophils Absolute Automated: 0.04 10*3/uL (ref 0.00–0.08)
Basophils Automated: 0.8 %
Eosinophils Absolute Automated: 0.1 10*3/uL (ref 0.00–0.44)
Eosinophils Automated: 2.1 %
Hematocrit: 35.2 % — ABNORMAL LOW (ref 37.6–49.6)
Hgb: 11.9 g/dL — ABNORMAL LOW (ref 12.5–17.1)
Immature Granulocytes Absolute: 0.01 10*3/uL (ref 0.00–0.07)
Immature Granulocytes: 0.2 %
Lymphocytes Absolute Automated: 1.59 10*3/uL (ref 0.42–3.22)
Lymphocytes Automated: 32.7 %
MCH: 30.6 pg (ref 25.1–33.5)
MCHC: 33.8 g/dL (ref 31.5–35.8)
MCV: 90.5 fL (ref 78.0–96.0)
MPV: 8.5 fL — ABNORMAL LOW (ref 8.9–12.5)
Monocytes Absolute Automated: 0.55 10*3/uL (ref 0.21–0.85)
Monocytes: 11.3 %
Neutrophils Absolute: 2.57 10*3/uL (ref 1.10–6.33)
Neutrophils: 52.9 %
Nucleated RBC: 0 /100 WBC (ref 0.0–0.0)
Platelets: 231 10*3/uL (ref 142–346)
RBC: 3.89 10*6/uL — ABNORMAL LOW (ref 4.20–5.90)
RDW: 13 % (ref 11–15)
WBC: 4.86 10*3/uL (ref 3.10–9.50)

## 2020-11-13 LAB — COMPREHENSIVE METABOLIC PANEL
ALT: 26 U/L (ref 0–55)
AST (SGOT): 29 U/L (ref 5–34)
Albumin/Globulin Ratio: 1 (ref 0.9–2.2)
Albumin: 3.8 g/dL (ref 3.5–5.0)
Alkaline Phosphatase: 43 U/L (ref 37–117)
Anion Gap: 8 (ref 5.0–15.0)
BUN: 26 mg/dL (ref 9.0–28.0)
Bilirubin, Total: 0.7 mg/dL (ref 0.2–1.2)
CO2: 28 mEq/L (ref 21–29)
Calcium: 10.3 mg/dL — ABNORMAL HIGH (ref 7.9–10.2)
Chloride: 103 mEq/L (ref 100–111)
Creatinine: 1.7 mg/dL — ABNORMAL HIGH (ref 0.5–1.5)
Globulin: 3.8 g/dL — ABNORMAL HIGH (ref 2.0–3.6)
Glucose: 104 mg/dL — ABNORMAL HIGH (ref 70–100)
Potassium: 4.7 mEq/L (ref 3.5–5.1)
Protein, Total: 7.6 g/dL (ref 6.0–8.3)
Sodium: 139 mEq/L (ref 136–145)

## 2020-11-13 LAB — HEMOLYSIS INDEX: Hemolysis Index: 6 Index (ref 0–24)

## 2020-11-13 LAB — GFR: EGFR: 47.1

## 2020-11-13 LAB — PSA: Prostate Specific Antigen, Total: 10.856 ng/mL — ABNORMAL HIGH (ref 0.000–4.000)

## 2020-11-13 LAB — TESTOSTERONE: Testosterone: 12 ng/dL — ABNORMAL LOW (ref 241–827)

## 2020-11-13 MED ORDER — IOHEXOL 350 MG/ML IV SOLN
100.0000 mL | Freq: Once | INTRAVENOUS | Status: AC | PRN
Start: 2020-11-13 — End: 2020-11-13
  Administered 2020-11-13: 100 mL via INTRAVENOUS

## 2020-11-13 MED ORDER — TECHNETIUM TC 99M OXIDRONATE INJECTION
23.2000 | Freq: Once | Status: AC | PRN
Start: 2020-11-13 — End: 2020-11-13
  Administered 2020-11-13: 23.2 via INTRAVENOUS
  Filled 2020-11-13: qty 30

## 2020-11-14 ENCOUNTER — Ambulatory Visit: Payer: Medicare Other | Attending: Hematology & Oncology | Admitting: Hematology & Oncology

## 2020-11-14 ENCOUNTER — Other Ambulatory Visit: Payer: Self-pay

## 2020-11-14 ENCOUNTER — Encounter: Payer: Self-pay | Admitting: Hematology & Oncology

## 2020-11-14 VITALS — BP 130/79 | HR 65 | Temp 97.4°F | Resp 13 | Ht 67.0 in | Wt 153.5 lb

## 2020-11-14 DIAGNOSIS — M8580 Other specified disorders of bone density and structure, unspecified site: Secondary | ICD-10-CM | POA: Insufficient documentation

## 2020-11-14 DIAGNOSIS — Z191 Hormone sensitive malignancy status: Secondary | ICD-10-CM

## 2020-11-14 DIAGNOSIS — C61 Malignant neoplasm of prostate: Secondary | ICD-10-CM | POA: Insufficient documentation

## 2020-11-14 DIAGNOSIS — Z79818 Long term (current) use of other agents affecting estrogen receptors and estrogen levels: Secondary | ICD-10-CM | POA: Insufficient documentation

## 2020-11-14 MED ORDER — PREDNISONE 5 MG PO TABS
5.0000 mg | ORAL_TABLET | Freq: Every day | ORAL | 11 refills | Status: AC
Start: 2020-11-14 — End: 2020-12-14

## 2020-11-14 NOTE — Progress Notes (Signed)
[facility] ONCOLOGY FOLLOW-UP NOTE      Date of service: November 14, 2020    Subjective:   Chief complaint: Tanner Lewis is a 80 y.o. year old male who returns for follow-up of metastatic prostate cancer.     History of Present Illness:  Tanner Lewis presents for routine follow-up.  He remains on Eligard q42mo. He denies any new complaints today. His chronic fatigue and hot flashes remain stable. He denies any pain. He is accompanied by his daughter today (another daughter is on speakerphone).       Oncology History:     Oncology History Overview Note   Adenocarcinoma of the prostate, stage IV (TX N1 M1a), castrate resistant  Original diagnosis in 2006. Initial PSA was 6.96 ng/ml.   Prostate biopsy on 02/18/05 confirmed a prostatic adenocarcinoma, Gleason 3+3=6, associated high-grade PIN.  S/P 125-I seed implant on 04/25/2005.  Developed biochemical recurrence, treated with ADT (Casodex/Lupron) from Dec 2011 to March 2013.   PSA 2.22 ng/ml on 09/09/2014.  Bone scan on 10/08/14 was negative for e/o bone metastases.   CT abd/pel on 10/08/14 showed "stable bilateral adrenal nodules and multiple prominent retroperitoneal adenopathy with the largest measuring 1.6 x 1.1 cm in the right iliac chain. There was also a new enlarged lymph node along the right pelvic sidewall measuring 1.4 x 1 cm. The retroperitoneal lymph nodes appear to be stable in appearance. The max dimension of the largest lymph node is 1.6 x 0.8 cm.   MRI prostate on 11/13/14 demonstrated a local recurrence in the left lobe of the prostate measuring 2.6 x 1.5 x 1.6 cm with deformation of the prostate capsule and likely extracapsular spread. There was a 1.5 x 1.2 cm right iliac chain lymph node, a 1.3 x 1.3 cm common iliac chain lymph node, additional smaller right iliac chain lymph nodes and right common iliac chain lymph nodes.   Began ADT with Lupron q6 mo again on 11/25/2014.   PSA on 11/25/14 was 33.67 ng/ml; 2.18 ng/ml on 02/05/15; 1.65 in Nov 2016; 1.34 on  05/30/15.   Patient was lost to follow-up and did not receive another dose of Lupron since June 2016.  CT chest on 07/24/15 negative for e/o metastatic disease.   Lupron was resumed on 12/21/16 and stopped again sometime in 2019.   PSA 1.62 on 06/08/17; 1.4 ng/ml on 06/20/17;   Established care with local PCP on 05/11/2019 and a PSA was found to be 3.217 ng/ml. Referred to urology for urinary frequency.   Renal US on 08/08/2019 was unremarkable.  Establish care with med oncology on 08/16/2019. PSA of 10.51 ng/ml on 08/17/2019.   CT c/a/p on 10/22/2019 showed a borderline enlarged AP window node which measures 1.4 x 1.4 cm. Indeterminate enhancing lesion in the right hepatic dome measures 1.9 x 1.7 cm. Indeterminate bilateral adrenal masses, 2.9 x 1.4 cm on right and 1.8 x 1.2 cm on left. Bilateral renal cysts noted, including a complex cyst in the upper pole right kidney which measures 4.2 x 3.0 cm, containing either internal septation or internal vessel. There is a more simple appearing cyst in the mid left kidney which measures 4.9 x 4.3 cm. Bladder wall mildly thickened. Multiple prostatic radiation seeds noted. Unremarkable seminal vesicles. There is bulky abdominal lymphadenopathy. A left periaortic lymph node measures 2.7 x 2.2 cm. Retroaortic lymph node just below the aortic bifurcation measures 2.3 x 2.0 cm. There is no pelvic lymphadenopathy. No bone lesions.   MRI abdomen w/wo  contrast on 11/13/2019 showed right liver lesion c/w hemangioma. Bilateral adrenal nodules consistent with adenomas. Renal lesions c/w cysts. Retroperitoneal adenopathy is partially imaged. For example a left periaortic lymph node measuring 3.1 x 2.6 cm.  S/P CT-guided biopsy of retroperitoneal lymph node on 11/21/2019. Pathology consistent with a metastatic poorly-differentiated adenocarcinoma. There is comedonecrosis that can be seen in Gleason pattern 5 prostatic adenocarcinoma or colorectal adenocarcinoma. Immunostains show that the  neoplastic cells are positive for NKX-3.1 and CDX-2, negative for CK7 and CK20, confirming the diagnosis of metastatic prostatic adenocarcinoma.    PSA 33.24 ng/ml on 11/29/2019.   Resume ADT with Casodex --> Eligard q8mo by 12/14/2019.   PSA down to 6.99 on 01/01/2020.   Bone scan on 01/01/2020 was negative for e/o bone metastases.   CT abdomen/pelvis on 02/21/2020. Hepatic dome mass: 2.3 x 2.1 cm (302:16), previously 1.9 x 1.7 cm. Right adrenal mass: 3.1 x 1.6 cm (302:46), previously 2.9 x 1.3 cm. Left adrenal mass: 2.0 x 1.3 cm (302:51), previously 1.8 x 1.2 cm. Left periaortic lymph node: 1.8 x 1.3 cm (302:70), previously 2.7 x 2.2 cm. Aortic bifurcation lymph node: 1.7 x 1.0 cm (302:86), previously 2.3 x 2.0 cm. Enhancing mass in hepatic dome appears slightly increased in size. Bilateral adrenal mass are seen also minimally increased in size from prior exam. Multiple complex cysts are seen in the both kidneys. Retroperitoneal lymph nodes are decreased in size.  PSA down to 3.2 on 02/29/2020.   In Oct 2021, offered to start him on a secondary oral antiandrogen, but he declined. Tanner Lewis out of town to [place] for 3 months.   CT c/a/p on 07/11/2020. Para-aortic lymph node: 1.1 x 1.0 cm, previously 1.7 x 1.3 cm. Left common iliac node: 1.6 x 0.5 cm, previously 1.7 x 0.9 cm. There is no suspicious pulmonary nodule, mass or consolidation. A 3 mm nodule in the lingula is stable. A 1.5 x 1.4 cm mildly enlarged AP window lymph node is without significant interval change, previously 1.4 x 1.4 cm. There is no new mediastinal, axillary or hilar lymphadenopathy. A 2.4 cm benign right hepatic hemangioma is stable. No suspicious hepatic mass is noted. There are stable bilateral adrenal nodules, previously characterized as adenomas on MRI from 11/13/2019. A 1.1 x 1.0 cm para-aortic lymph node has slightly decreased in size, previously 1.3 x 1.1 cm. There is no new abdominal/pelvic lymphadenopathy. Prostate radiation seeds are noted. No  aggressive osseous lesion is identified.   Bone scan on 07/11/2020 was negative for e/o bone metastases.  PSA up to 6.05 on 07/23/2020. Eligard given on 07/23/2020 (a month later than it was due).  PSA of 7.9 on 10/08/2020; 10.9 on 11/13/2020--> now castrate-resistant  Bone scan on 11/13/2020 was negative for e/o bone metastases.   CT c/a/p on 11/13/2020. No mediastinal, hilar, or axillary lymphadenopathy is identified. Mild fusiform bronchiectasis in portions of both lungs. Scattered mild bilateral linear scarring and/or atelectasis. No hydronephrosis. 2.4 x 1.8 cm hypervascular liver lesion previously felt to represent a hemangioma measured 2.3 x 1.8 cm on the most recent study. Multiple bilateral renal cysts. Peripheral calcifications are again evident along a portion of a stable 3.8 x 3.4 cm exophytic cyst arising from the upper pole region of the right kidney. Stable bilateral adrenal nodules. No focal lesions are identified in remaining solid abdominal visceral organs. Para-aortic lymph node measures 1.4 x 1.0 cm versus 1.3 x 1.0 cm previously. Stable diffuse urinary bladder wall thickening. Multiple brachytherapy seeds in and adjacent  to the prostate gland. No suspicious osseous lesions.  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       12/14/2019 -  Supportive Therapy    [facility]) - OP Adult - Prostate - Leuprolide (ELIGARD) 45 mg Q 6 Month  Plan Provider: Ronald Lobo, MD  Treatment goal: Control  Line of treatment: [No plan line of treatment]           Past Medical History:  Past Medical History:   Diagnosis Date    Hypertension     Prostate cancer 2010       Patient Active Problem List   Diagnosis    Essential hypertension    History of prostate cancer    Fatigue, unspecified type    Keeps losing balance    Age-related cataract of both eyes, unspecified age-related  cataract type    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, bilateral    Prostate cancer - treated with brachytherapy approx 2010, [place]    Nocturia    Weak urinary stream    Rising PSA following treatment for malignant neoplasm of prostate    Right renal mass    Mass of both adrenal glands    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    Stage 3b chronic kidney disease       Medications:  Current Outpatient Medications   Medication Sig Dispense Refill    amLODIPine-benazepril (LOTREL 5-10) 5-10 MG per capsule Take 1 capsule by mouth daily 90 capsule 1    hydrALAZINE (APRESOLINE) 50 MG tablet Take 1 tablet (50 mg total) by mouth 3 (three) times daily Take 1 tablet by mouth three times daily 270 tablet 3    isosorbide dinitrate (ISORDIL) 20 MG tablet Take 1 tablet (20 mg total) by mouth 2 (two) times daily 180 tablet 3    Mirabegron ER (Myrbetriq) 50 MG Tablet SR 24 hr Take 1 tablet (50 mg total) by mouth daily 90 tablet 3    simvastatin (ZOCOR) 20 MG tablet Take 1 tablet (20 mg total) by mouth nightly Take one tablet by mouth at bedtime 90 tablet 3    tamsulosin (FLOMAX) 0.4 MG Cap Take 1 capsule (0.4 mg total) by mouth Daily after dinner Take 1 capsule by mouth daily. 90 capsule 1    venlafaxine (EFFEXOR) 75 MG tablet Take 1 tablet (75 mg total) by mouth daily 90 tablet 1    predniSONE (DELTASONE) 5 MG tablet Take 1 tablet (5 mg total) by mouth daily 30 tablet 11     No current facility-administered medications for this visit.       Allergies, Social History, and Family History were reviewed and updated.      Tobacco Dependence:  Social History     Tobacco Use   Smoking Status Former    Packs/day: 1.50    Years: 20.00    Pack years: 30.00    Types: Cigarettes    Quit date: 05/10/2014    Years since quitting: 6.5   Smokeless Tobacco Never       Smoking Cessation Counseling:  Date: November 14, 2020  N/A    Physical Exam:  Vitals:    11/14/20 1016   BP: 130/79   Pulse: 65   Resp: 13   Temp: 97.4 F (36.3 C)   SpO2:  98%  Body surface area is 1.81 meters squared.  ECOG performance status: 0-1  Psychosocial: Appropriate affect. Supportive daughters. Ongoing concerns regarding memory loss and cognitive deficits.    General Appearance:  Alert, cooperative, in no distress, appears stated age  Head:  Normocephalic without obvious abnormalities, atraumatic  Eyes: Intact EOM. No scleral icterus, conjunctiva/corneas clear  Nose: Deferred, wearing mask  Throat:  Deferred, wearing mask  Skin: Normal color and turgor, no rashes  Neurologic: Cranial nerves grossly intact, no focal motor deficits, normal speech and mentation. Stable gait, uses a cane    Full exam deferred today      Labs:     Recent Results (from the past 2688 hour(s))   CBC and differential    Collection Time: 10/08/20  8:46 AM   Result Value Ref Range    WBC 4.63 3.10 - 9.50 x10 3/uL    Hgb 12.0 (L) 12.5 - 17.1 g/dL    Hematocrit 16.1 (L) 37.6 - 49.6 %    Platelets 208 142 - 346 x10 3/uL    RBC 3.91 (L) 4.20 - 5.90 x10 6/uL    MCV 91.6 78.0 - 96.0 fL    MCH 30.7 25.1 - 33.5 pg    MCHC 33.5 31.5 - 35.8 g/dL    RDW 13 11 - 15 %    MPV 8.9 8.9 - 12.5 fL    Neutrophils 54.1 None %    Lymphocytes Automated 32.2 None %    Monocytes 11.4 None %    Eosinophils Automated 1.7 None %    Basophils Automated 0.4 None %    Immature Granulocytes 0.2 None %    Nucleated RBC 0.0 0.0 - 0.0 /100 WBC    Neutrophils Absolute 2.50 1.10 - 6.33 x10 3/uL    Lymphocytes Absolute Automated 1.49 0.42 - 3.22 x10 3/uL    Monocytes Absolute Automated 0.53 0.21 - 0.85 x10 3/uL    Eosinophils Absolute Automated 0.08 0.00 - 0.44 x10 3/uL    Basophils Absolute Automated 0.02 0.00 - 0.08 x10 3/uL    Immature Granulocytes Absolute 0.01 0.00 - 0.07 x10 3/uL    Absolute NRBC 0.00 0.00 - 0.00 x10 3/uL   Comprehensive metabolic panel    Collection Time: 10/08/20  8:46 AM   Result Value Ref Range    Glucose 110 (H) 70 - 100 mg/dL    BUN 09.6 9.0 - 04.5 mg/dL    Creatinine 1.6 (H) 0.5 - 1.5 mg/dL    Sodium 409  811 - 914 mEq/L    Potassium 4.2 3.5 - 5.1 mEq/L    Chloride 105 100 - 111 mEq/L    CO2 27 21 - 29 mEq/L    Calcium 9.9 7.9 - 10.2 mg/dL    Protein, Total 7.5 6.0 - 8.3 g/dL    Albumin 3.8 3.5 - 5.0 g/dL    AST (SGOT) 32 5 - 34 U/L    ALT 23 0 - 55 U/L    Alkaline Phosphatase 41 37 - 117 U/L    Bilirubin, Total 0.7 0.2 - 1.2 mg/dL    Globulin 3.7 (H) 2.0 - 3.6 g/dL    Albumin/Globulin Ratio 1.0 0.9 - 2.2    Anion Gap 7.0 5.0 - 15.0   PSA    Collection Time: 10/08/20  8:46 AM   Result Value Ref Range    Prostate Specific Antigen, Total 7.947 (H) 0.000 - 4.000 ng/mL   Testosterone    Collection Time: 10/08/20  8:46 AM  Result Value Ref Range    Testosterone 13 (L) 241 - 827 ng/dL   GFR    Collection Time: 10/08/20  8:46 AM   Result Value Ref Range    EGFR 50.6    Hemolysis index    Collection Time: 10/08/20  8:46 AM   Result Value Ref Range    Hemolysis Index 5 0 - 24   CBC and differential    Collection Time: 11/13/20  9:20 AM   Result Value Ref Range    WBC 4.86 3.10 - 9.50 x10 3/uL    Hgb 11.9 (L) 12.5 - 17.1 g/dL    Hematocrit 45.4 (L) 37.6 - 49.6 %    Platelets 231 142 - 346 x10 3/uL    RBC 3.89 (L) 4.20 - 5.90 x10 6/uL    MCV 90.5 78.0 - 96.0 fL    MCH 30.6 25.1 - 33.5 pg    MCHC 33.8 31.5 - 35.8 g/dL    RDW 13 11 - 15 %    MPV 8.5 (L) 8.9 - 12.5 fL    Neutrophils 52.9 None %    Lymphocytes Automated 32.7 None %    Monocytes 11.3 None %    Eosinophils Automated 2.1 None %    Basophils Automated 0.8 None %    Immature Granulocytes 0.2 None %    Nucleated RBC 0.0 0.0 - 0.0 /100 WBC    Neutrophils Absolute 2.57 1.10 - 6.33 x10 3/uL    Lymphocytes Absolute Automated 1.59 0.42 - 3.22 x10 3/uL    Monocytes Absolute Automated 0.55 0.21 - 0.85 x10 3/uL    Eosinophils Absolute Automated 0.10 0.00 - 0.44 x10 3/uL    Basophils Absolute Automated 0.04 0.00 - 0.08 x10 3/uL    Immature Granulocytes Absolute 0.01 0.00 - 0.07 x10 3/uL    Absolute NRBC 0.00 0.00 - 0.00 x10 3/uL   Comprehensive metabolic panel    Collection  Time: 11/13/20  9:20 AM   Result Value Ref Range    Glucose 104 (H) 70 - 100 mg/dL    BUN 09.8 9.0 - 11.9 mg/dL    Creatinine 1.7 (H) 0.5 - 1.5 mg/dL    Sodium 147 829 - 562 mEq/L    Potassium 4.7 3.5 - 5.1 mEq/L    Chloride 103 100 - 111 mEq/L    CO2 28 21 - 29 mEq/L    Calcium 10.3 (H) 7.9 - 10.2 mg/dL    Protein, Total 7.6 6.0 - 8.3 g/dL    Albumin 3.8 3.5 - 5.0 g/dL    AST (SGOT) 29 5 - 34 U/L    ALT 26 0 - 55 U/L    Alkaline Phosphatase 43 37 - 117 U/L    Bilirubin, Total 0.7 0.2 - 1.2 mg/dL    Globulin 3.8 (H) 2.0 - 3.6 g/dL    Albumin/Globulin Ratio 1.0 0.9 - 2.2    Anion Gap 8.0 5.0 - 15.0   PSA    Collection Time: 11/13/20  9:20 AM   Result Value Ref Range    Prostate Specific Antigen, Total 10.856 (H) 0.000 - 4.000 ng/mL   Testosterone    Collection Time: 11/13/20  9:20 AM   Result Value Ref Range    Testosterone 12 (L) 241 - 827 ng/dL   GFR    Collection Time: 11/13/20  9:20 AM   Result Value Ref Range    EGFR 47.1    Hemolysis index    Collection Time: 11/13/20  9:20 AM  Result Value Ref Range    Hemolysis Index 6 0 - 24 Index   .    Rads:     Bone scan on 11/13/2020 was negative for e/o bone metastases.     CT c/a/p on 11/13/2020. No mediastinal, hilar, or axillary lymphadenopathy is identified. Mild fusiform bronchiectasis in portions of both lungs. Scattered mild bilateral linear scarring and/or atelectasis. No hydronephrosis. 2.4 x 1.8 cm hypervascular liver lesion previously felt to represent a hemangioma measured 2.3 x 1.8 cm on the most recent study. Multiple bilateral renal cysts. Peripheral calcifications are again evident along a portion of a stable 3.8 x 3.4 cm exophytic cyst arising from the upper pole region of the right kidney. Stable bilateral adrenal nodules. No focal lesions are identified in remaining solid abdominal visceral organs. Para-aortic lymph node measures 1.4 x 1.0 cm versus 1.3 x 1.0 cm previously. Stable diffuse urinary bladder wall thickening. Multiple brachytherapy seeds in  and adjacent to the prostate gland. No suspicious osseous lesions.    Assessment:     1. Metastatic castration-sensitive adenocarcinoma of prostate    2. Androgen deprivation therapy    3. Osteopenia, unspecified location    4. Lower urinary tract symptoms (LUTS)         Recommendations:     Adenocarcinoma of the prostate, stage IV (TX N1 M1a), castrate-resistant. Oncologic history dating back to initial diagnosis in 2006 is summarized above. Prior to establishing care with me, he was receiving ADT intermittently for biochemical recurrence, then for nodal metastases. By the time I met him in March 2021 for a rising PSA level, he had been off ADT for at least 2 years and his PSA had risen up to 10.5 by 08/17/2019. CT c/a/p and MRI abdomen done in May/June 2021 were notable for retroperitoneal adenopathy. CT-guided retroperitoneal LN biopsy on 11/21/2019 confirmed diagnosis of metastatic prostate cancer. Bone scan on 01/01/2020 was negative for e/o bone metastases.  ADT was resumed in July 2021 and he remains on q28mo Eligard now (due again in Aug 2022). His PSA decreased from 33.2 in July 2021, down to 3.2 on 02/29/2020, consistent with response to ADT but since then, he has risen to 8 on 5/11 and 10.9 on 11/13/2020, consistent with development of castrate resistance. His bone scan on 11/13/2020 was negative for e/o bone metastases and his CT c/a/p on 6/16 was overall stable. Para-aortic LN now measures 1.4 x 1 cm, up slightly from 1.3 x 1 cm. Given clear rise in his PSA level on Eligard only indicating castrate-resistant metastatic prostate cancer, I recommended Zytiga 1000 mg/d plus prednisone 5 mg/d. Would favor this over enzalutamide given his age and frailty (concern for increased CNS toxicity, falls/imbalance with enza). Potential side effects of Zytiga were discussed and he agreed to proceed.     Androgen deprivation therapy/Osteopenia. DEXA scan in May 2021 c/w osteopenia of the left femoral neck. Normal BMD in the  spine. Continue Ca and vit D supplementation for bone health.     Miscellaneous. He plans on spending half the year here and the other half in [place] where he has another daughter. He and his daughter here [name]) state that they intend on all keeping all medical appts and treatments here if possible, but I have advised them to consider establishing care with a medical oncologist in [place] for the times he will be in [place].     Follow-up. We agreed for Tanner Lewis to return in early August for labs before an office  visit and Eligard on 8/24.  I encouraged the patient and his daughters to call or message me via MyChart with any questions or concerns prior to their next appointment here. They had many relevant questions which I answered to the best of my ability and to their apparent satisfaction. They voiced understanding and agreed to the plan outlined above.       LOS Documentation:    Number and Complexity of Problems Addressed:  1 or more chronic illness with exacerbation, progression or side effects of treatment    Amount and/or Complexity of Data Reviewed:  - Reviewed the following results from 2022: 3+ unique labs and 2 unique imaging tests  - Ordered the following unique test(s): 3+ unique labs  - Performed an assessment requiring an independent historian - daughter    Risk of Complications and/or Morbidity or Mortality of Patient Management:  Drug therapy requiring intensive monitoring for toxicity        I spent a total of 45 minutes reviewing the patient's chart, including relevant radiological and laboratory findings, pathology reports, and available outside records, counseling, medical decision making, coordination of care, and documentation for today's encounter.    Disclaimer: This note was dictated with voice recognition software. Similar sounding words can inadvertently be transcribed and may not be corrected upon review.    Signed by:    Serena Croissant, MD  Medical Oncologist  [facility]  [address], [facility] 5th Floor  [place], [place] [address]  Tel: [phone]  Fax: [phone]     Ronald Lobo, MD  6/17/202210:40 AM

## 2020-11-14 NOTE — Progress Notes (Signed)
Tanner Lewis  Legal: [name]  Male, 80 y.o., 1941/04/19  MRN: [id number]    Provider:  Dr. Ronald Lobo  Diagnosis & Code:  Prostate cancer (C61)  Regimen:  Abiraterone  Dosing/Frequency/Duration:    1000 mg/day  Growth Factor? (if Y, specify):  N  NCCN?:    YES/NCCN (needs 6 days for tx plan/auth)   Treatment Location: home  Start Date: 11/21/2020    Notes:    >See Provider's recent Note.  Please contact patient's daughter.

## 2020-11-17 ENCOUNTER — Telehealth: Payer: Self-pay

## 2020-11-17 NOTE — Telephone Encounter (Signed)
Prior authorization pending

## 2020-11-18 NOTE — Telephone Encounter (Signed)
PA Outcome: Approved    Dates in effect, from 10/18/2020 until further notice.    Reference #: [id number]      Pt has high co-pay ofr [phone]. Will contact pt for FA.

## 2020-11-19 NOTE — Telephone Encounter (Signed)
Emailed PAP forms to pt. S/w pt's daughter who will have pt sign and send back to me today.

## 2020-11-24 NOTE — Telephone Encounter (Signed)
Received signed forms from pt's daughter today. Faxed completed application to [employer]. Est. TAT, 5-7 business days.

## 2020-11-27 ENCOUNTER — Other Ambulatory Visit: Payer: Self-pay

## 2020-11-27 MED ORDER — ABIRATERONE ACETATE 250 MG PO TABS
1000.0000 mg | ORAL_TABLET | Freq: Every day | ORAL | 5 refills | Status: DC
Start: 2020-11-27 — End: 2021-05-21

## 2020-11-27 NOTE — Telephone Encounter (Signed)
FA Outcome: Approved    Dates in effect, from 11/26/2020 through 05/30/2021    Reference #: [id number]    Source and Phone Number:  [facility] Patient [facility].  (P) [phone]    Pt will receive a call from [facility] pharmacy by Tuesday to schedule first shipment. Emailed pt's daughter to inform her of approval and let her know pt has to sign and return the Medicare Attestation form that will be mailed to him in order keep his approval through 05/30/2021.

## 2020-12-09 NOTE — Telephone Encounter (Addendum)
Oral Chemotherapy Patient Education: Pharmacist    Included in education (select all that apply):Zytiga 250 mg tablet    [x]   Reviewed drug regimen: Take 4 tablets by mouth daily with prednisone 5 mg tablets  [x]   Drug education: take on an empty stomach  [x]   Side effects: fatigue, edema, electrolyte imbalances, hypertension, joint swelling, muscle aches  [x]   Side effect management  [x]   Dosing schedule: Take 4 tablets by mouth daily with prednisone 5 mg tablets        [x]   Monitoring parameters, LFTs, potassium, psa as indicated        [x]   Reviewed pertinent labs : 11/13/20: pertinante labs reviewed: monitor EGFR (47.1)  [x]   Reviewed current home medications, including herbal, OTC, cannabis, and supplements.  [x]   Had in-depth discussion about additional home medications to accompany treatment and I reviewed the following:  [x]   Reviewed drug-drug interactions.  [x]   Reviewed drug-food interactions.  [x]   Explained what to do about regimen associated missed doses.  [x]   Explained regimen associated drug handling, storage, and disposal at home.  [x]   Explained proper handling of body secretions and waste in the home.  [x]   verbalize understanding and all questions were addressed.  [x]   Action Plan: Spoke to patients daughter, Patient received Roosvelt Maser and began taking it last Thursday. Confirmed dosing and administration instructions. Patients daughter states that since starting he has become a bit more forgetful (misremembering when he should take his medication). She said that she will give it a few more days since he just started taking it but if it continues to progress they will reach out to the office for further instructions.    Dalbert Batman, PharmD

## 2020-12-22 ENCOUNTER — Other Ambulatory Visit (INDEPENDENT_AMBULATORY_CARE_PROVIDER_SITE_OTHER): Payer: Self-pay | Admitting: Family Medicine

## 2020-12-22 DIAGNOSIS — I10 Essential (primary) hypertension: Secondary | ICD-10-CM

## 2020-12-26 ENCOUNTER — Encounter (INDEPENDENT_AMBULATORY_CARE_PROVIDER_SITE_OTHER): Payer: Self-pay

## 2021-01-07 ENCOUNTER — Ambulatory Visit: Payer: No Typology Code available for payment source

## 2021-01-07 ENCOUNTER — Other Ambulatory Visit: Payer: Medicare Other

## 2021-01-12 ENCOUNTER — Other Ambulatory Visit: Payer: Medicare Other

## 2021-01-12 ENCOUNTER — Other Ambulatory Visit (FREE_STANDING_LABORATORY_FACILITY): Payer: Medicare Other

## 2021-01-12 DIAGNOSIS — Z79818 Long term (current) use of other agents affecting estrogen receptors and estrogen levels: Secondary | ICD-10-CM

## 2021-01-12 DIAGNOSIS — Z191 Hormone sensitive malignancy status: Secondary | ICD-10-CM

## 2021-01-12 DIAGNOSIS — C61 Malignant neoplasm of prostate: Secondary | ICD-10-CM

## 2021-01-12 LAB — CBC AND DIFFERENTIAL
Absolute NRBC: 0 10*3/uL (ref 0.00–0.00)
Basophils Absolute Automated: 0.03 10*3/uL (ref 0.00–0.08)
Basophils Automated: 0.6 %
Eosinophils Absolute Automated: 0.08 10*3/uL (ref 0.00–0.44)
Eosinophils Automated: 1.7 %
Hematocrit: 36.3 % — ABNORMAL LOW (ref 37.6–49.6)
Hgb: 12.1 g/dL — ABNORMAL LOW (ref 12.5–17.1)
Immature Granulocytes Absolute: 0.02 10*3/uL (ref 0.00–0.07)
Immature Granulocytes: 0.4 %
Lymphocytes Absolute Automated: 1.78 10*3/uL (ref 0.42–3.22)
Lymphocytes Automated: 38.4 %
MCH: 30.6 pg (ref 25.1–33.5)
MCHC: 33.3 g/dL (ref 31.5–35.8)
MCV: 91.7 fL (ref 78.0–96.0)
MPV: 8.3 fL — ABNORMAL LOW (ref 8.9–12.5)
Monocytes Absolute Automated: 0.54 10*3/uL (ref 0.21–0.85)
Monocytes: 11.7 %
Neutrophils Absolute: 2.18 10*3/uL (ref 1.10–6.33)
Neutrophils: 47.2 %
Nucleated RBC: 0 /100 WBC (ref 0.0–0.0)
Platelets: 219 10*3/uL (ref 142–346)
RBC: 3.96 10*6/uL — ABNORMAL LOW (ref 4.20–5.90)
RDW: 14 % (ref 11–15)
WBC: 4.63 10*3/uL (ref 3.10–9.50)

## 2021-01-12 LAB — COMPREHENSIVE METABOLIC PANEL
ALT: 17 U/L (ref 0–55)
AST (SGOT): 26 U/L (ref 5–34)
Albumin/Globulin Ratio: 1 (ref 0.9–2.2)
Albumin: 3.7 g/dL (ref 3.5–5.0)
Alkaline Phosphatase: 46 U/L (ref 37–117)
Anion Gap: 6 (ref 5.0–15.0)
BUN: 20 mg/dL (ref 9.0–28.0)
Bilirubin, Total: 0.9 mg/dL (ref 0.2–1.2)
CO2: 30 mEq/L — ABNORMAL HIGH (ref 21–29)
Calcium: 9.9 mg/dL (ref 7.9–10.2)
Chloride: 102 mEq/L (ref 100–111)
Creatinine: 1.7 mg/dL — ABNORMAL HIGH (ref 0.5–1.5)
Globulin: 3.7 g/dL — ABNORMAL HIGH (ref 2.0–3.6)
Glucose: 106 mg/dL — ABNORMAL HIGH (ref 70–100)
Potassium: 3.5 mEq/L (ref 3.5–5.1)
Protein, Total: 7.4 g/dL (ref 6.0–8.3)
Sodium: 138 mEq/L (ref 136–145)

## 2021-01-12 LAB — HEMOLYSIS INDEX: Hemolysis Index: 4 Index (ref 0–24)

## 2021-01-12 LAB — PSA: Prostate Specific Antigen, Total: 1.259 ng/mL (ref 0.000–4.000)

## 2021-01-12 LAB — TESTOSTERONE: Testosterone: 3 ng/dL — ABNORMAL LOW (ref 241–827)

## 2021-01-12 LAB — GFR: EGFR: 47.1

## 2021-01-15 ENCOUNTER — Other Ambulatory Visit: Payer: Self-pay | Admitting: Hematology & Oncology

## 2021-01-15 DIAGNOSIS — C772 Secondary and unspecified malignant neoplasm of intra-abdominal lymph nodes: Secondary | ICD-10-CM

## 2021-01-21 ENCOUNTER — Ambulatory Visit: Payer: Medicare Other | Attending: Hematology & Oncology | Admitting: Hematology & Oncology

## 2021-01-21 ENCOUNTER — Other Ambulatory Visit: Payer: Medicare Other

## 2021-01-21 ENCOUNTER — Encounter: Payer: Self-pay | Admitting: Hematology & Oncology

## 2021-01-21 ENCOUNTER — Ambulatory Visit: Payer: Medicare Other

## 2021-01-21 VITALS — BP 130/66 | HR 74 | Temp 97.6°F | Resp 15 | Ht 67.0 in | Wt 153.9 lb

## 2021-01-21 VITALS — BP 121/81 | HR 81 | Temp 97.7°F | Wt 154.0 lb

## 2021-01-21 DIAGNOSIS — C61 Malignant neoplasm of prostate: Secondary | ICD-10-CM

## 2021-01-21 DIAGNOSIS — Z192 Hormone resistant malignancy status: Secondary | ICD-10-CM

## 2021-01-21 DIAGNOSIS — C772 Secondary and unspecified malignant neoplasm of intra-abdominal lymph nodes: Secondary | ICD-10-CM

## 2021-01-21 DIAGNOSIS — R2689 Other abnormalities of gait and mobility: Secondary | ICD-10-CM

## 2021-01-21 DIAGNOSIS — R351 Nocturia: Secondary | ICD-10-CM

## 2021-01-21 DIAGNOSIS — Z5111 Encounter for antineoplastic chemotherapy: Secondary | ICD-10-CM | POA: Insufficient documentation

## 2021-01-21 DIAGNOSIS — Z79818 Long term (current) use of other agents affecting estrogen receptors and estrogen levels: Secondary | ICD-10-CM

## 2021-01-21 DIAGNOSIS — M8580 Other specified disorders of bone density and structure, unspecified site: Secondary | ICD-10-CM

## 2021-01-21 LAB — COMPREHENSIVE METABOLIC PANEL
ALT: 17 U/L (ref 0–55)
AST (SGOT): 22 U/L (ref 5–34)
Albumin/Globulin Ratio: 1.1 (ref 0.9–2.2)
Albumin: 3.6 g/dL (ref 3.5–5.0)
Alkaline Phosphatase: 44 U/L (ref 37–117)
Anion Gap: 7 (ref 5.0–15.0)
BUN: 19 mg/dL (ref 9.0–28.0)
Bilirubin, Total: 0.8 mg/dL (ref 0.2–1.2)
CO2: 26 mEq/L (ref 21–29)
Calcium: 9.4 mg/dL (ref 7.9–10.2)
Chloride: 106 mEq/L (ref 100–111)
Creatinine: 1.5 mg/dL (ref 0.5–1.5)
Globulin: 3.4 g/dL (ref 2.0–3.6)
Glucose: 102 mg/dL — ABNORMAL HIGH (ref 70–100)
Potassium: 3.6 mEq/L (ref 3.5–5.1)
Protein, Total: 7 g/dL (ref 6.0–8.3)
Sodium: 139 mEq/L (ref 136–145)

## 2021-01-21 LAB — CBC AND DIFFERENTIAL
Absolute NRBC: 0 10*3/uL (ref 0.00–0.00)
Basophils Absolute Automated: 0.02 10*3/uL (ref 0.00–0.08)
Basophils Automated: 0.4 %
Eosinophils Absolute Automated: 0.08 10*3/uL (ref 0.00–0.44)
Eosinophils Automated: 1.8 %
Hematocrit: 34.6 % — ABNORMAL LOW (ref 37.6–49.6)
Hgb: 11.5 g/dL — ABNORMAL LOW (ref 12.5–17.1)
Immature Granulocytes Absolute: 0.01 10*3/uL (ref 0.00–0.07)
Immature Granulocytes: 0.2 %
Lymphocytes Absolute Automated: 2.08 10*3/uL (ref 0.42–3.22)
Lymphocytes Automated: 46.2 %
MCH: 30.5 pg (ref 25.1–33.5)
MCHC: 33.2 g/dL (ref 31.5–35.8)
MCV: 91.8 fL (ref 78.0–96.0)
MPV: 8.4 fL — ABNORMAL LOW (ref 8.9–12.5)
Monocytes Absolute Automated: 0.51 10*3/uL (ref 0.21–0.85)
Monocytes: 11.3 %
Neutrophils Absolute: 1.8 10*3/uL (ref 1.10–6.33)
Neutrophils: 40.1 %
Nucleated RBC: 0 /100 WBC (ref 0.0–0.0)
Platelets: 202 10*3/uL (ref 142–346)
RBC: 3.77 10*6/uL — ABNORMAL LOW (ref 4.20–5.90)
RDW: 14 % (ref 11–15)
WBC: 4.5 10*3/uL (ref 3.10–9.50)

## 2021-01-21 LAB — PSA: Prostate Specific Antigen, Total: 1.043 ng/mL (ref 0.000–4.000)

## 2021-01-21 LAB — HEMOLYSIS INDEX: Hemolysis Index: 3 Index (ref 0–24)

## 2021-01-21 LAB — GFR: EGFR: 54.4

## 2021-01-21 LAB — TESTOSTERONE: Testosterone: 4 ng/dL — ABNORMAL LOW (ref 241–827)

## 2021-01-21 MED ORDER — LEUPROLIDE ACETATE (6 MONTH) 45 MG SC KIT
45.0000 mg | PACK | Freq: Once | SUBCUTANEOUS | Status: AC
Start: 2021-01-21 — End: 2021-01-21
  Administered 2021-01-21: 13:00:00 45 mg via SUBCUTANEOUS
  Filled 2021-01-21: qty 45

## 2021-01-21 NOTE — Progress Notes (Signed)
[facility] ONCOLOGY FOLLOW-UP NOTE      Date of service: January 21, 2021    Subjective:   Chief complaint: Tanner Lewis is a 80 y.o. year old male who returns for follow-up of metastatic prostate cancer.     History of Present Illness:  Tanner Lewis presents for routine follow-up.  He remains on Eligard q28mo, due again today. He was started on Zytiga 1000 mg/d and prednisone 5 mg/d two months ago and has been taking it consistently. He has not felt well on it and endorses significant fatigue, chronic headaches, mental fogginess. He had mild nausea at the beginning, but no diarrhea.  His daughter has noticed a shuffling gait for the past two months and his hands sometimes get a little shaky when he is holding a cup to drink something. He denies any new pain. His nocturia is severe and he has been getting up over 20 times a night to urinate. He is accompanied by one of his daughters today.      Oncology History:     Oncology History Overview Note   Adenocarcinoma of the prostate, stage IV (TX N1 M1a), castration resistant  Original diagnosis in 2006. Initial PSA was 6.96 ng/ml.   Prostate biopsy on 02/18/05 confirmed a prostatic adenocarcinoma, Gleason 3+3=6, associated high-grade PIN.  S/P 125-I seed implant on 04/25/2005.  Developed biochemical recurrence, treated with ADT (Casodex/Lupron) from Dec 2011 to March 2013.   PSA 2.22 ng/ml on 09/09/2014.  Bone scan on 10/08/14 was negative for e/o bone metastases.   CT abd/pel on 10/08/14 showed "stable bilateral adrenal nodules and multiple prominent retroperitoneal adenopathy with the largest measuring 1.6 x 1.1 cm in the right iliac chain. There was also a new enlarged lymph node along the right pelvic sidewall measuring 1.4 x 1 cm. The retroperitoneal lymph nodes appear to be stable in appearance. The max dimension of the largest lymph node is 1.6 x 0.8 cm.   MRI prostate on 11/13/14 demonstrated a local recurrence in the left lobe of the prostate measuring 2.6 x 1.5 x  1.6 cm with deformation of the prostate capsule and likely extracapsular spread. There was a 1.5 x 1.2 cm right iliac chain lymph node, a 1.3 x 1.3 cm common iliac chain lymph node, additional smaller right iliac chain lymph nodes and right common iliac chain lymph nodes.   Began ADT with Lupron q6 mo again on 11/25/2014.   PSA on 11/25/14 was 33.67 ng/ml; 2.18 ng/ml on 02/05/15; 1.65 in Nov 2016; 1.34 on 05/30/15.   Patient was lost to follow-up and did not receive another dose of Lupron since June 2016.  CT chest on 07/24/15 negative for e/o metastatic disease.   Lupron was resumed on 12/21/16 and stopped again sometime in 2019.   PSA 1.62 on 06/08/17; 1.4 ng/ml on 06/20/17;   Established care with local PCP on 05/11/2019 and a PSA was found to be 3.217 ng/ml. Referred to urology for urinary frequency.   Renal US on 08/08/2019 was unremarkable.  Establish care with med oncology on 08/16/2019. PSA of 10.51 ng/ml on 08/17/2019.   CT c/a/p on 10/22/2019 showed a borderline enlarged AP window node which measures 1.4 x 1.4 cm. Indeterminate enhancing lesion in the right hepatic dome measures 1.9 x 1.7 cm. Indeterminate bilateral adrenal masses, 2.9 x 1.4 cm on right and 1.8 x 1.2 cm on left. Bilateral renal cysts noted, including a complex cyst in the upper pole right kidney which measures 4.2 x 3.0 cm,  containing either internal septation or internal vessel. There is a more simple appearing cyst in the mid left kidney which measures 4.9 x 4.3 cm. Bladder wall mildly thickened. Multiple prostatic radiation seeds noted. Unremarkable seminal vesicles. There is bulky abdominal lymphadenopathy. A left periaortic lymph node measures 2.7 x 2.2 cm. Retroaortic lymph node just below the aortic bifurcation measures 2.3 x 2.0 cm. There is no pelvic lymphadenopathy. No bone lesions.   MRI abdomen w/wo contrast on 11/13/2019 showed right liver lesion c/w hemangioma. Bilateral adrenal nodules consistent with adenomas. Renal lesions c/w cysts.  Retroperitoneal adenopathy is partially imaged. For example a left periaortic lymph node measuring 3.1 x 2.6 cm.  S/P CT-guided biopsy of retroperitoneal lymph node on 11/21/2019. Pathology consistent with a metastatic poorly-differentiated adenocarcinoma. There is comedonecrosis that can be seen in Gleason pattern 5 prostatic adenocarcinoma or colorectal adenocarcinoma. Immunostains show that the neoplastic cells are positive for NKX-3.1 and CDX-2, negative for CK7 and CK20, confirming the diagnosis of metastatic prostatic adenocarcinoma.    PSA 33.24 ng/ml on 11/29/2019.   Resume ADT with Casodex --> Eligard q70mo by 12/14/2019.   PSA down to 6.99 on 01/01/2020.   Bone scan on 01/01/2020 was negative for e/o bone metastases.   CT abdomen/pelvis on 02/21/2020. Hepatic dome mass: 2.3 x 2.1 cm (302:16), previously 1.9 x 1.7 cm. Right adrenal mass: 3.1 x 1.6 cm (302:46), previously 2.9 x 1.3 cm. Left adrenal mass: 2.0 x 1.3 cm (302:51), previously 1.8 x 1.2 cm. Left periaortic lymph node: 1.8 x 1.3 cm (302:70), previously 2.7 x 2.2 cm. Aortic bifurcation lymph node: 1.7 x 1.0 cm (302:86), previously 2.3 x 2.0 cm. Enhancing mass in hepatic dome appears slightly increased in size. Bilateral adrenal mass are seen also minimally increased in size from prior exam. Multiple complex cysts are seen in the both kidneys. Retroperitoneal lymph nodes are decreased in size.  PSA down to 3.2 on 02/29/2020.   In Oct 2021, offered to start him on a secondary oral antiandrogen, but he declined. [name] out of town to [employer] for 3 months.   CT c/a/p on 07/11/2020. Para-aortic lymph node: 1.1 x 1.0 cm, previously 1.7 x 1.3 cm. Left common iliac node: 1.6 x 0.5 cm, previously 1.7 x 0.9 cm. There is no suspicious pulmonary nodule, mass or consolidation. A 3 mm nodule in the lingula is stable. A 1.5 x 1.4 cm mildly enlarged AP window lymph node is without significant interval change, previously 1.4 x 1.4 cm. There is no new mediastinal, axillary or hilar  lymphadenopathy. A 2.4 cm benign right hepatic hemangioma is stable. No suspicious hepatic mass is noted. There are stable bilateral adrenal nodules, previously characterized as adenomas on MRI from 11/13/2019. A 1.1 x 1.0 cm para-aortic lymph node has slightly decreased in size, previously 1.3 x 1.1 cm. There is no new abdominal/pelvic lymphadenopathy. Prostate radiation seeds are noted. No aggressive osseous lesion is identified.   Bone scan on 07/11/2020 was negative for e/o bone metastases.  PSA up to 6.05 on 07/23/2020. Eligard given on 07/23/2020 (a month later than it was due).  PSA of 7.9 on 10/08/2020; 10.9 on 11/13/2020--> now castrate-resistant  Bone scan on 11/13/2020 was negative for e/o bone metastases.   CT c/a/p on 11/13/2020. No mediastinal, hilar, or axillary lymphadenopathy is identified. Mild fusiform bronchiectasis in portions of both lungs. Scattered mild bilateral linear scarring and/or atelectasis. No hydronephrosis. 2.4 x 1.8 cm hypervascular liver lesion previously felt to represent a hemangioma measured 2.3 x 1.8  cm on the most recent study. Multiple bilateral renal cysts. Peripheral calcifications are again evident along a portion of a stable 3.8 x 3.4 cm exophytic cyst arising from the upper pole region of the right kidney. Stable bilateral adrenal nodules. No focal lesions are identified in remaining solid abdominal visceral organs. Para-aortic lymph node measures 1.4 x 1.0 cm versus 1.3 x 1.0 cm previously. Stable diffuse urinary bladder wall thickening. Multiple brachytherapy seeds in and adjacent to the prostate gland. No suspicious osseous lesions.  On 11/14/2020, agree to start abiraterone/prednisone. PSA of 10.86 on 11/13/2020.   On 01/12/2021, PSA decreased to 1.26 ng/ml.      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     12/14/2019 -  Supportive Therapy    [facility]) - OP Adult - Prostate -  Leuprolide (ELIGARD) 45 mg Q 6 Month  Plan Provider: Ronald Lobo, MD  Treatment goal: Control  Line of treatment: [No plan line of treatment]           Past Medical History:  Past Medical History:   Diagnosis Date    Hypertension     Prostate cancer 2010       Patient Active Problem List   Diagnosis    Essential hypertension    History of prostate cancer    Fatigue, unspecified type    Keeps losing balance    Age-related cataract of both eyes, unspecified age-related cataract type    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, bilateral    Prostate cancer - treated with brachytherapy approx 2010, [place]    Nocturia    Weak urinary stream    Rising PSA following treatment for malignant neoplasm of prostate    Right renal mass    Mass of both adrenal glands    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    Stage 3b chronic kidney disease       Medications:  Current Outpatient Medications   Medication Sig Dispense Refill    abiraterone (ZYTIGA) 250 MG tablet Take 4 tablets (1,000 mg total) by mouth daily 120 tablet 5    amLODIPine-benazepril (LOTREL 5-10) 5-10 MG per capsule Take 1 capsule by mouth daily 90 capsule 1    hydrALAZINE (APRESOLINE) 50 MG tablet Take 1 tablet (50 mg total) by mouth 3 (three) times daily Take 1 tablet by mouth three times daily 270 tablet 3    isosorbide dinitrate (ISORDIL) 20 MG tablet Take 1 tablet (20 mg total) by mouth 2 (two) times daily 180 tablet 3    Mirabegron ER (Myrbetriq) 50 MG Tablet SR 24 hr Take 1 tablet (50 mg total) by mouth daily 90 tablet 3    predniSONE (DELTASONE) 5 MG tablet       simvastatin (ZOCOR) 20 MG tablet Take 1 tablet (20 mg total) by mouth nightly Take one tablet by mouth at bedtime 90 tablet 3    tamsulosin (FLOMAX) 0.4 MG Cap Take 1 capsule (0.4 mg total) by mouth Daily after dinner Take 1 capsule by mouth daily. 90 capsule 1    venlafaxine (EFFEXOR) 75 MG tablet Take 1 tablet (75 mg total) by mouth daily 90 tablet 1     No current facility-administered  medications for this visit.       Allergies, Social History, and Family History were reviewed and updated.      Tobacco Dependence:  Social History  Tobacco Use   Smoking Status Former    Packs/day: 1.50    Years: 20.00    Pack years: 30.00    Types: Cigarettes    Quit date: 05/10/2014    Years since quitting: 6.7   Smokeless Tobacco Never       Smoking Cessation Counseling:  Date: January 21, 2021  N/A    Physical Exam:  Vitals:    01/21/21 1134   BP: 130/66   Pulse: 74   Resp: 15   Temp: 97.6 F (36.4 C)   SpO2: 98%         Body surface area is 1.82 meters squared.  ECOG performance status: 0-1  Psychosocial: Appropriate affect. Supportive daughters. Ongoing concerns regarding memory loss and cognitive deficits.    General Appearance:  Alert, cooperative, in no distress, appears stated age  Head:  Normocephalic without obvious abnormalities, atraumatic  Eyes: Intact EOM. No scleral icterus, conjunctiva/corneas clear  Nose: Deferred, wearing mask  Throat:  Deferred, wearing mask  Neck: Supple, symmetrical, trachea midline  Lungs: Clear to auscultation bilaterally, respirations are unlabored, excursion symmetrical  Heart: Regular rate and rhythm, S1 and S2 normal, no murmurs, no tachycardia  Abdomen: Soft, non-tender, non-distended, bowel sounds normoactive, no palpable masses or organomegaly  Genitourinary: No costovertebral tenderness  Extremities: No cyanosis or edema  Skin: Normal color and turgor, no rashes or lesions  Musculoskeletal: No spinal or paraspinal tenderness  Neurologic: Cranial nerves grossly intact, no focal motor deficits, normal speech and mentation, normal gait    Labs:     Recent Results (from the past 336 hour(s))   CBC and differential    Collection Time: 01/12/21 10:36 AM   Result Value Ref Range    WBC 4.63 3.10 - 9.50 x10 3/uL    Hgb 12.1 (L) 12.5 - 17.1 g/dL    Hematocrit 40.9 (L) 37.6 - 49.6 %    Platelets 219 142 - 346 x10 3/uL    RBC 3.96 (L) 4.20 - 5.90 x10 6/uL    MCV 91.7 78.0 -  96.0 fL    MCH 30.6 25.1 - 33.5 pg    MCHC 33.3 31.5 - 35.8 g/dL    RDW 14 11 - 15 %    MPV 8.3 (L) 8.9 - 12.5 fL    Neutrophils 47.2 None %    Lymphocytes Automated 38.4 None %    Monocytes 11.7 None %    Eosinophils Automated 1.7 None %    Basophils Automated 0.6 None %    Immature Granulocytes 0.4 None %    Nucleated RBC 0.0 0.0 - 0.0 /100 WBC    Neutrophils Absolute 2.18 1.10 - 6.33 x10 3/uL    Lymphocytes Absolute Automated 1.78 0.42 - 3.22 x10 3/uL    Monocytes Absolute Automated 0.54 0.21 - 0.85 x10 3/uL    Eosinophils Absolute Automated 0.08 0.00 - 0.44 x10 3/uL    Basophils Absolute Automated 0.03 0.00 - 0.08 x10 3/uL    Immature Granulocytes Absolute 0.02 0.00 - 0.07 x10 3/uL    Absolute NRBC 0.00 0.00 - 0.00 x10 3/uL   PSA    Collection Time: 01/12/21 10:36 AM   Result Value Ref Range    Prostate Specific Antigen, Total 1.259 0.000 - 4.000 ng/mL   Testosterone    Collection Time: 01/12/21 10:36 AM   Result Value Ref Range    Testosterone 3 (L) 241 - 827 ng/dL   Comprehensive metabolic panel    Collection Time: 01/12/21 10:36  AM   Result Value Ref Range    Glucose 106 (H) 70 - 100 mg/dL    BUN 16.1 9.0 - 09.6 mg/dL    Creatinine 1.7 (H) 0.5 - 1.5 mg/dL    Sodium 045 409 - 811 mEq/L    Potassium 3.5 3.5 - 5.1 mEq/L    Chloride 102 100 - 111 mEq/L    CO2 30 (H) 21 - 29 mEq/L    Calcium 9.9 7.9 - 10.2 mg/dL    Protein, Total 7.4 6.0 - 8.3 g/dL    Albumin 3.7 3.5 - 5.0 g/dL    AST (SGOT) 26 5 - 34 U/L    ALT 17 0 - 55 U/L    Alkaline Phosphatase 46 37 - 117 U/L    Bilirubin, Total 0.9 0.2 - 1.2 mg/dL    Globulin 3.7 (H) 2.0 - 3.6 g/dL    Albumin/Globulin Ratio 1.0 0.9 - 2.2    Anion Gap 6.0 5.0 - 15.0   GFR    Collection Time: 01/12/21 10:36 AM   Result Value Ref Range    EGFR 47.1    Hemolysis index    Collection Time: 01/12/21 10:36 AM   Result Value Ref Range    Hemolysis Index 4 0 - 24 Index   CBC and differential    Collection Time: 01/21/21 11:25 AM   Result Value Ref Range    WBC 4.50 3.10 - 9.50 x10  3/uL    Hgb 11.5 (L) 12.5 - 17.1 g/dL    Hematocrit 91.4 (L) 37.6 - 49.6 %    Platelets 202 142 - 346 x10 3/uL    RBC 3.77 (L) 4.20 - 5.90 x10 6/uL    MCV 91.8 78.0 - 96.0 fL    MCH 30.5 25.1 - 33.5 pg    MCHC 33.2 31.5 - 35.8 g/dL    RDW 14 11 - 15 %    MPV 8.4 (L) 8.9 - 12.5 fL    Neutrophils 40.1 None %    Lymphocytes Automated 46.2 None %    Monocytes 11.3 None %    Eosinophils Automated 1.8 None %    Basophils Automated 0.4 None %    Immature Granulocytes 0.2 None %    Nucleated RBC 0.0 0.0 - 0.0 /100 WBC    Neutrophils Absolute 1.80 1.10 - 6.33 x10 3/uL    Lymphocytes Absolute Automated 2.08 0.42 - 3.22 x10 3/uL    Monocytes Absolute Automated 0.51 0.21 - 0.85 x10 3/uL    Eosinophils Absolute Automated 0.08 0.00 - 0.44 x10 3/uL    Basophils Absolute Automated 0.02 0.00 - 0.08 x10 3/uL    Immature Granulocytes Absolute 0.01 0.00 - 0.07 x10 3/uL    Absolute NRBC 0.00 0.00 - 0.00 x10 3/uL   Comprehensive metabolic panel    Collection Time: 01/21/21 11:25 AM   Result Value Ref Range    Glucose 102 (H) 70 - 100 mg/dL    BUN 78.2 9.0 - 95.6 mg/dL    Creatinine 1.5 0.5 - 1.5 mg/dL    Sodium 213 086 - 578 mEq/L    Potassium 3.6 3.5 - 5.1 mEq/L    Chloride 106 100 - 111 mEq/L    CO2 26 21 - 29 mEq/L    Calcium 9.4 7.9 - 10.2 mg/dL    Protein, Total 7.0 6.0 - 8.3 g/dL    Albumin 3.6 3.5 - 5.0 g/dL    AST (SGOT) 22 5 - 34 U/L    ALT  17 0 - 55 U/L    Alkaline Phosphatase 44 37 - 117 U/L    Bilirubin, Total 0.8 0.2 - 1.2 mg/dL    Globulin 3.4 2.0 - 3.6 g/dL    Albumin/Globulin Ratio 1.1 0.9 - 2.2    Anion Gap 7.0 5.0 - 15.0   GFR    Collection Time: 01/21/21 11:25 AM   Result Value Ref Range    EGFR 54.4    Hemolysis index    Collection Time: 01/21/21 11:25 AM   Result Value Ref Range    Hemolysis Index 3 0 - 24 Index   .    Rads:     No new imaging since last office visit.     Assessment:     1. Metastatic castration-resistant adenocarcinoma of prostate    2. Androgen deprivation therapy    3. Osteopenia, unspecified  location    4. Nocturia    5. Shuffling gait      Recommendations:     Adenocarcinoma of the prostate, stage IV (TX N1 M1a), castrate-resistant. Oncologic history dating back to initial diagnosis in 2006 is summarized above. Prior to establishing care with me, he was receiving ADT intermittently for biochemical recurrence, then for nodal metastases. By the time I met him in March 2021 for a rising PSA level, he had been off ADT for at least 2 years and his PSA had risen up to 10.5 by 08/17/2019. CT c/a/p and MRI abdomen done in May/June 2021 were notable for retroperitoneal adenopathy. CT-guided retroperitoneal LN biopsy on 11/21/2019 confirmed diagnosis of metastatic prostate cancer. Bone scan on 01/01/2020 was negative for e/o bone metastases.  ADT was resumed in July 2021 and he has remained on q68mo Eligard continuously since then. His PSA decreased from 33.2 in July 2021, down to 3.2 on 02/29/2020, consistent with response to ADT but began to rise up to 10.9 by 11/13/2020, consistent with development of castrate resistance. His bone scan on 11/13/2020 was negative for e/o bone metastases and his CT c/a/p on 6/16 showed stable mildly para-aortic lymphadenopathy (measures 1.4 x 1 cm, up slightly from 1.3 x 1 cm). In June 2022, we agreed to start him on Zytiga 1000 mg/d plus prednisone 5 mg/d for his metastatic CRPC. On exam today, he endorses several fairly significant side effects including increased fatigue, mental fogginess, chronic headaches. His daughter has noticed a shuffling gait over the past 2 months, as well. His PSA on 8/15 has decreased to 1.26 ng/ml, consistent with response to adding Zytiga. But given his side effects, we agreed for him to hold Zytiga completely for the next two weeks (continue prednisone 5 mg/d), and to resume it at a lower dose of 500 mg/d if he is back to baseline in 2 weeks before his next follow-up appt with me in a month. He will otherwise continue Eligard every 6 months (due today).      Androgen deprivation therapy/Osteopenia. DEXA scan in May 2021 c/w osteopenia of the left femoral neck. Normal BMD in the spine. Continue Ca and vit D supplementation for bone health.     Nocturia. Severe, gets up to urinate at least 20 times per night. He has an appt with urology coming up soon.    Shuffling gait. Began soon after starting Zytiga, possibly indicative of generalized muscle weakness. Will monitor for improvement off Zytiga and if no improvement, will refer him to neurology for further evaluation.     Miscellaneous. He plans on spending half the year here  and the other half in [place] where he has another daughter. He and his daughter here [name]) state that they intend on all keeping all medical appts and treatments here if possible, but I have advised them to consider establishing care with a medical oncologist in [place] for the times he will be in [place].     Follow-up. We agreed for Tanner Lewis to return in one month for labs and office visit. I encouraged the patient and his daughters to call or message me via MyChart with any questions or concerns prior to their next appointment here. They had many relevant questions which I answered to the best of my ability and to their apparent satisfaction. They voiced understanding and agreed to the plan outlined above.       LOS Documentation:    Number and Complexity of Problems Addressed:  1 or more chronic illness with exacerbation, progression or side effects of treatment    Amount and/or Complexity of Data Reviewed:  - Reviewed the following results from 2022: 3+ unique labs  - Ordered the following unique test(s): 3+ unique labs  - Performed an assessment requiring an independent historian - daughter    Risk of Complications and/or Morbidity or Mortality of Patient Management:  Drug therapy requiring intensive monitoring for toxicity        I spent a total of 45 minutes reviewing the patient's chart, including relevant radiological and laboratory  findings, pathology reports, and available outside records, counseling, medical decision making, coordination of care, and documentation for today's encounter.    Disclaimer: This note was dictated with voice recognition software. Similar sounding words can inadvertently be transcribed and may not be corrected upon review.    Signed by:    Serena Croissant, MD  Medical Oncologist  [facility]  [address], [facility] 5th [address]  [place], [place] [address]  Tel: [phone]  Fax: [phone]     Ronald Lobo, MD  8/24/202212:17 PM

## 2021-01-21 NOTE — Progress Notes (Signed)
Time 1235    Geary Herd is a 80 y.o. male admitted to clinic for Eligard injection. Pt has no new complaints.     Eligard given SQ in RUE.  Pt tolerated injection without incident.    See MAR, VS, Doc Flowsheets for further information. Pt to call MD for problems, questions, concerns.    Time 1255, discharged in NAD.    RTC 2/22 for next eligard injections.     Laddie Aquas, RN

## 2021-01-29 ENCOUNTER — Other Ambulatory Visit: Payer: Self-pay | Admitting: Hematology & Oncology

## 2021-01-29 ENCOUNTER — Other Ambulatory Visit: Payer: Self-pay

## 2021-01-29 DIAGNOSIS — Z79818 Long term (current) use of other agents affecting estrogen receptors and estrogen levels: Secondary | ICD-10-CM

## 2021-01-29 DIAGNOSIS — C61 Malignant neoplasm of prostate: Secondary | ICD-10-CM

## 2021-01-29 DIAGNOSIS — Z192 Hormone resistant malignancy status: Secondary | ICD-10-CM

## 2021-01-29 MED ORDER — PREDNISONE 5 MG PO TABS
5.0000 mg | ORAL_TABLET | Freq: Every day | ORAL | 3 refills | Status: DC
Start: 2021-01-29 — End: 2021-07-24

## 2021-01-29 MED ORDER — PREDNISONE 5 MG PO TABS
5.0000 mg | ORAL_TABLET | Freq: Every day | ORAL | 3 refills | Status: DC
Start: 2021-01-29 — End: 2021-01-29

## 2021-02-09 ENCOUNTER — Ambulatory Visit (INDEPENDENT_AMBULATORY_CARE_PROVIDER_SITE_OTHER): Payer: Medicare Other | Admitting: Urology

## 2021-02-09 ENCOUNTER — Other Ambulatory Visit (INDEPENDENT_AMBULATORY_CARE_PROVIDER_SITE_OTHER): Payer: Self-pay | Admitting: Family Medicine

## 2021-02-09 ENCOUNTER — Encounter (INDEPENDENT_AMBULATORY_CARE_PROVIDER_SITE_OTHER): Payer: Self-pay | Admitting: Urology

## 2021-02-09 VITALS — BP 113/71 | HR 77

## 2021-02-09 DIAGNOSIS — N1832 Chronic kidney disease, stage 3b: Secondary | ICD-10-CM

## 2021-02-09 DIAGNOSIS — C772 Secondary and unspecified malignant neoplasm of intra-abdominal lymph nodes: Secondary | ICD-10-CM

## 2021-02-09 DIAGNOSIS — R351 Nocturia: Secondary | ICD-10-CM

## 2021-02-09 DIAGNOSIS — N2889 Other specified disorders of kidney and ureter: Secondary | ICD-10-CM

## 2021-02-09 DIAGNOSIS — C61 Malignant neoplasm of prostate: Secondary | ICD-10-CM

## 2021-02-09 DIAGNOSIS — R3912 Poor urinary stream: Secondary | ICD-10-CM

## 2021-02-09 MED ORDER — TROSPIUM CHLORIDE 20 MG PO TABS
20.0000 mg | ORAL_TABLET | Freq: Two times a day (BID) | ORAL | 3 refills | Status: DC
Start: 2021-02-09 — End: 2021-06-12

## 2021-02-09 NOTE — Patient Instructions (Signed)
Today we discussed a plan to try to improve your urinary habits   -Please keep a 2-day voiding diary, specifically recording how much volume of urine you produce during your awake daytime hours versus you are asleep nighttime hours   -We discussed that sometimes people will begin to produce significantly more urine at nighttime, and occasionally there is medication that can improve this    We also discussed the possibility that the urinary symptoms are related to some overactivity of the bladder, since we have previously seen that the prostate is not particularly enlarged and we know that the bladder empties well with urination   -I have prescribed trospium 20 mg.   -Please take TWICE per day (not once per day, as previously suggested, based on the current function of the kidneys)    Follow-up in 2 months, to review your voiding diary, and discuss how you are doing

## 2021-02-09 NOTE — Progress Notes (Signed)
Subjective:      Patient ID: Tanner Lewis is a 80 y.o. male     Chief Complaint:  1) prostate cancer  2) nocturia  3) intermittent urinary stream     March 2021 - telemedicine, Dr. Andrey Campanile  This is a pleasant 79yoM.  He is accompanied by one of his daughters for this video visit.  He generally switches between living in [place] and living in [place], where he lives with one of his daughters.  He has a history of prostate cancer, this was diagnosed approximately in 2010 and he was treated in [place] with brachytherapy.  He states he has been told he has no prostate cancer recurrence.  He is unaware of most recent PSA values, but has had a new value checked December 2020, and the value is 3.2 ng/mL.  He has been having worsening urinary symptoms.  He has nocturia 5-6 times per night, and weak stream and urinary frequency throughout the day.  Denies hematuria, dysuria, flank pain.  He does have incontinence, and wears Depends underwear throughout the day for this.  Currently taking oxybutynin 5 mg TID, and Flomax 0.4 mg daily.    June 2021 - telemedicine, Dr. Dimple Casey    02/09/2021  In March 2021, he underwent a renal bladder ultrasound demonstrating a 25 g prostate, and complete emptying of the bladder, with 30 mL postvoid residual.  He underwent a cystoscopy, showing no significant enlargement of the prostate or obstruction of the bladder outlet.  At that time was having some changes with his mentation, and his oxybutynin and Myrbetriq were stopped.  He has continued his Flomax, taking 0.4 mg daily.  His nocturia has worsened, and he now has nocturia between 10-20 times per night.  He is able to fall asleep easily between urinations.  He also endorses decreased daytime frequency of urination, urinating 3 or 4 times throughout the day, while simultaneously having increased nighttime urination.  He endorses a good urinary stream though with some start and stop pattern to his urinary stream.      The following portions of  the patient's history were reviewed and updated as appropriate: allergies, current medications, past family history, past medical history, past social history, past surgical history and problem list.    Review of Systems  ROS: Pertinent ROS as noted above. Otherwise negative or non-contributory.       Objective:   BP 113/71 (BP Site: Left arm, Patient Position: Sitting, Cuff Size: Large)    Pulse 77   General appearance - alert, well appearing, and in no distress  Mental status - alert, oriented to person, place, and time        Lab Review   PSA  Order: [id number]  Status: Final result    Visible to patient: Yes (not seen)    Next appt: 02/13/2021 at 11:20 AM in Lab [facility])    Dx: Androgen deprivation therapy; Metasta...    0 Result Notes    1 Patient Communication  Component   Ref Range & Units 01/21/21 11:25 AM 01/12/21 10:36 AM 11/13/20  9:20 AM 10/08/20  8:46 AM 07/23/20 12:58 PM 02/29/20 12:25 PM 01/01/20  9:41 AM   Prostate Specific Antigen, Total   0.000 - 4.000 ng/mL 1.043  1.259 CM  10.856 High  CM  7.947 High  CM  6.048 High  CM  3.186 CM  6.988 High           Radiology Review   CT Chest Abdomen Pelvis  W Contrast [[id number]] (Order [id number])  Status: Final result     Study Result    Narrative & Impression   HISTORY: Metastatic prostate cancer.     COMPARISON: Prior studies including 07/11/2020 and 11/13/2019.     TECHNIQUE: CT of the chest, abdomen, and pelvis performed with 100 mL of  Omnipaque 350 intravenous contrast. The following dose reduction  techniques were utilized: automated exposure control and/or adjustment  of the mA and/or KV according to patient size, and the use of an  iterative reconstruction technique.     FINDINGS: No mediastinal, hilar, or axillary lymphadenopathy is  identified.      No pleural or pericardial effusion. Prominent coronary artery  calcifications.     Minimal gynecomastia.     Mild fusiform bronchiectasis in portions of both lungs. Scattered mild  bilateral linear  scarring and/or atelectasis. Small amount of mucus or  other debris in the trachea. No focal consolidation or discrete  pulmonary nodule.     No biliary dilatation. Gallbladder is grossly unremarkable.      No hydronephrosis.     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     Multiple bilateral renal cysts. Peripheral calcifications are again  evident along a portion of a stable 3.8 x 3.4 cm exophytic cyst arising  from the upper pole region of the right kidney. Stable bilateral adrenal  nodules. No focal lesions are identified in remaining solid abdominal  visceral organs.      Para-aortic lymph node measures 1.4 x 1.0 cm versus 1.3 x 1.0 cm  previously. No ascites. Mild ectasia of the infrarenal abdominal aorta  measures 2.3 cm in diameter. 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 Multifocal atherosclerotic calcifications.     No bowel dilatation. Prominent volume of fecal material portions of the  colon which may represent constipation. Stable diffuse urinary bladder  wall thickening. Multiple brachytherapy seeds in and adjacent to the  prostate gland.     Degenerative changes in the spine. Mild retrolisthesis at L2-3 and L3-4.  Mild anterolisthesis at L4-5. No suspicious osseous lesions.     IMPRESSION:    Stable mild retroperitoneal lymphadenopathy.     Please see above for additional findings.     Darra Lis, MD   11/13/2020 4:29 PM           Assessment:     1. 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 Prostate cancer - treated with brachytherapy approx 2010, [place]    3. Stage 3b chronic kidney disease    4. Nocturia    5. Weak urinary stream    6. Right renal mass      Today we discussed that his urinary symptoms have worsened.  It is not entirely clear whether the tamsulosin is helping much, however it seems as though stopping the Myrbetriq and oxybutynin last year seems  to potentially have worsened his symptoms somewhat, as he is now experiencing nocturia 2 or 3 times as much as he was last year.  We have identified that he emptied his bladder well based on ultrasound performed last year.  Cystoscopy has demonstrated no significant prostate enlargement.  I have recommended trying a trial of Sanctura, which I have dosed twice daily due to his stage III CKD.  We also discussed the possibility of nocturnal polyuria, given that he has significant  nighttime urinations, without any daytime frequency.  I have provided a voiding diary for them to fill out so we can identify whether he does truly have nocturnal polyuria.    09323 (40-54 minutes)  I spent 40 total  minutes for the following activities:  Interviewing/examining the patient  Reviewing relevant notes  Reviewing/interpreting/ordering tests and medications  Counseling and educating the patient/family/caregiver with care coordination  Documenting/charting clinical information in EPIC.      Plan:     Patient Instructions   Today we discussed a plan to try to improve your urinary habits   -Please keep a 2-day voiding diary, specifically recording how much volume of urine you produce during your awake daytime hours versus you are asleep nighttime hours   -We discussed that sometimes people will begin to produce significantly more urine at nighttime, and occasionally there is medication that can improve this    We also discussed the possibility that the urinary symptoms are related to some overactivity of the bladder, since we have previously seen that the prostate is not particularly enlarged and we know that the bladder empties well with urination   -I have prescribed trospium 20 mg.   -Please take TWICE per day (not once per day, as previously suggested, based on the current function of the kidneys)    Follow-up in 2 months, to review your voiding diary, and discuss how you are doing      Orders  No orders of the defined types were  placed in this encounter.

## 2021-02-11 NOTE — Telephone Encounter (Signed)
Medication: tamsulosin (FLOMAX) 0.4 MG Cap  Last filled: 08/01/2020  Last Appointment: 07/10/2020  Last Labs:01/21/2021   Next Appointment:none

## 2021-02-12 MED ORDER — TAMSULOSIN HCL 0.4 MG PO CAPS
0.4000 mg | ORAL_CAPSULE | Freq: Every day | ORAL | 0 refills | Status: DC
Start: 2021-02-12 — End: 2021-03-19

## 2021-02-13 ENCOUNTER — Other Ambulatory Visit: Payer: Medicare Other

## 2021-02-14 ENCOUNTER — Other Ambulatory Visit (FREE_STANDING_LABORATORY_FACILITY): Payer: Medicare Other

## 2021-02-14 DIAGNOSIS — Z79818 Long term (current) use of other agents affecting estrogen receptors and estrogen levels: Secondary | ICD-10-CM

## 2021-02-14 DIAGNOSIS — Z192 Hormone resistant malignancy status: Secondary | ICD-10-CM

## 2021-02-14 DIAGNOSIS — C61 Malignant neoplasm of prostate: Secondary | ICD-10-CM

## 2021-02-14 LAB — COMPREHENSIVE METABOLIC PANEL
ALT: 22 U/L (ref 0–55)
AST (SGOT): 30 U/L (ref 5–41)
Albumin/Globulin Ratio: 1 (ref 0.9–2.2)
Albumin: 3.8 g/dL (ref 3.5–5.0)
Alkaline Phosphatase: 49 U/L (ref 37–117)
Anion Gap: 7 (ref 5.0–15.0)
BUN: 19 mg/dL (ref 9.0–28.0)
Bilirubin, Total: 0.7 mg/dL (ref 0.2–1.2)
CO2: 30 mEq/L — ABNORMAL HIGH (ref 17–29)
Calcium: 10.3 mg/dL — ABNORMAL HIGH (ref 7.9–10.2)
Chloride: 103 mEq/L (ref 99–111)
Creatinine: 1.5 mg/dL (ref 0.5–1.5)
Globulin: 4 g/dL — ABNORMAL HIGH (ref 2.0–3.6)
Glucose: 90 mg/dL (ref 70–100)
Potassium: 3.9 mEq/L (ref 3.5–5.3)
Protein, Total: 7.8 g/dL (ref 6.0–8.3)
Sodium: 140 mEq/L (ref 135–145)

## 2021-02-14 LAB — HEMOLYSIS INDEX: Hemolysis Index: 7 Index (ref 0–24)

## 2021-02-14 LAB — CBC AND DIFFERENTIAL
Absolute NRBC: 0 10*3/uL (ref 0.00–0.00)
Basophils Absolute Automated: 0.02 10*3/uL (ref 0.00–0.08)
Basophils Automated: 0.4 %
Eosinophils Absolute Automated: 0.1 10*3/uL (ref 0.00–0.44)
Eosinophils Automated: 2.1 %
Hematocrit: 34.9 % — ABNORMAL LOW (ref 37.6–49.6)
Hgb: 11.8 g/dL — ABNORMAL LOW (ref 12.5–17.1)
Immature Granulocytes Absolute: 0.01 10*3/uL (ref 0.00–0.07)
Immature Granulocytes: 0.2 %
Lymphocytes Absolute Automated: 1.82 10*3/uL (ref 0.42–3.22)
Lymphocytes Automated: 38.8 %
MCH: 30.6 pg (ref 25.1–33.5)
MCHC: 33.8 g/dL (ref 31.5–35.8)
MCV: 90.6 fL (ref 78.0–96.0)
MPV: 8 fL — ABNORMAL LOW (ref 8.9–12.5)
Monocytes Absolute Automated: 0.46 10*3/uL (ref 0.21–0.85)
Monocytes: 9.8 %
Neutrophils Absolute: 2.28 10*3/uL (ref 1.10–6.33)
Neutrophils: 48.7 %
Nucleated RBC: 0 /100 WBC (ref 0.0–0.0)
Platelets: 283 10*3/uL (ref 142–346)
RBC: 3.85 10*6/uL — ABNORMAL LOW (ref 4.20–5.90)
RDW: 13 % (ref 11–15)
WBC: 4.69 10*3/uL (ref 3.10–9.50)

## 2021-02-14 LAB — PSA: Prostate Specific Antigen, Total: 0.814 ng/mL (ref 0.000–4.000)

## 2021-02-14 LAB — GFR: EGFR: 54.4

## 2021-02-15 LAB — TESTOSTERONE: Testosterone: 3 ng/dL — ABNORMAL LOW (ref 241–827)

## 2021-02-19 ENCOUNTER — Ambulatory Visit: Payer: Medicare Other | Attending: Hematology & Oncology | Admitting: Hematology & Oncology

## 2021-02-19 ENCOUNTER — Encounter: Payer: Self-pay | Admitting: Hematology & Oncology

## 2021-02-19 VITALS — BP 126/69 | HR 81 | Temp 97.3°F | Resp 15 | Ht 67.0 in | Wt 151.4 lb

## 2021-02-19 DIAGNOSIS — R351 Nocturia: Secondary | ICD-10-CM | POA: Insufficient documentation

## 2021-02-19 DIAGNOSIS — M8580 Other specified disorders of bone density and structure, unspecified site: Secondary | ICD-10-CM | POA: Insufficient documentation

## 2021-02-19 DIAGNOSIS — R2689 Other abnormalities of gait and mobility: Secondary | ICD-10-CM | POA: Insufficient documentation

## 2021-02-19 DIAGNOSIS — Z79818 Long term (current) use of other agents affecting estrogen receptors and estrogen levels: Secondary | ICD-10-CM | POA: Insufficient documentation

## 2021-02-19 DIAGNOSIS — C61 Malignant neoplasm of prostate: Secondary | ICD-10-CM | POA: Insufficient documentation

## 2021-02-19 DIAGNOSIS — C772 Secondary and unspecified malignant neoplasm of intra-abdominal lymph nodes: Secondary | ICD-10-CM | POA: Insufficient documentation

## 2021-02-19 NOTE — Progress Notes (Signed)
[facility] ON[facility]Y FOLLOW-UP NOTE      Date of service: February 19, 2021    Subjective:   Chief complaint: Tanner Lewis is a 80 y.o. year old male who returns for follow-up of metastatic prostate cancer.     History of Present Illness:  Tanner Lewis presents for routine follow-up.  He remains on Eligard q60mo, due again today. He has been on Zytiga 500 mg/d and prednisone 5 mg/d for the past month and he does feel he is tolerating it better compared to 1000 mg/d dose. He does remain fatigued and his gait and tremulousness have improved from what his daughter can tell. His nocturia remains significant and he attributes his fatigue during the day in part to lack of sleep.       Oncology History:     Oncology History Overview Note   Adenocarcinoma of the prostate, stage IV (TX N1 M1a), castration resistant  Original diagnosis in 2006. Initial PSA was 6.96 ng/ml.   Prostate biopsy on 02/18/05 confirmed a prostatic adenocarcinoma, Gleason 3+3=6, associated high-grade PIN.  S/P 125-I seed implant on 04/25/2005.  Developed biochemical recurrence, treated with ADT (Casodex/Lupron) from Dec 2011 to March 2013.   PSA 2.22 ng/ml on 09/09/2014.  Bone scan on 10/08/14 was negative for e/o bone metastases.   CT abd/pel on 10/08/14 showed "stable bilateral adrenal nodules and multiple prominent retroperitoneal adenopathy with the largest measuring 1.6 x 1.1 cm in the right iliac chain. There was also a new enlarged lymph node along the right pelvic sidewall measuring 1.4 x 1 cm. The retroperitoneal lymph nodes appear to be stable in appearance. The max dimension of the largest lymph node is 1.6 x 0.8 cm.   MRI prostate on 11/13/14 demonstrated a local recurrence in the left lobe of the prostate measuring 2.6 x 1.5 x 1.6 cm with deformation of the prostate capsule and likely extracapsular spread. There was a 1.5 x 1.2 cm right iliac chain lymph node, a 1.3 x 1.3 cm common iliac chain lymph node, additional smaller right iliac chain  lymph nodes and right common iliac chain lymph nodes.   Began ADT with Lupron q6 mo again on 11/25/2014.   PSA on 11/25/14 was 33.67 ng/ml; 2.18 ng/ml on 02/05/15; 1.65 in Nov 2016; 1.34 on 05/30/15.   Patient was lost to follow-up and did not receive another dose of Lupron since June 2016.  CT chest on 07/24/15 negative for e/o metastatic disease.   Lupron was resumed on 12/21/16 and stopped again sometime in 2019.   PSA 1.62 on 06/08/17; 1.4 ng/ml on 06/20/17;   Established care with local PCP on 05/11/2019 and a PSA was found to be 3.217 ng/ml. Referred to urology for urinary frequency.   Renal US on 08/08/2019 was unremarkable.  Establish care with med oncology on 08/16/2019. PSA of 10.51 ng/ml on 08/17/2019.   CT c/a/p on 10/22/2019 showed a borderline enlarged AP window node which measures 1.4 x 1.4 cm. Indeterminate enhancing lesion in the right hepatic dome measures 1.9 x 1.7 cm. Indeterminate bilateral adrenal masses, 2.9 x 1.4 cm on right and 1.8 x 1.2 cm on left. Bilateral renal cysts noted, including a complex cyst in the upper pole right kidney which measures 4.2 x 3.0 cm, containing either internal septation or internal vessel. There is a more simple appearing cyst in the mid left kidney which measures 4.9 x 4.3 cm. Bladder wall mildly thickened. Multiple prostatic radiation seeds noted. Unremarkable seminal vesicles. There is bulky abdominal  lymphadenopathy. A left periaortic lymph node measures 2.7 x 2.2 cm. Retroaortic lymph node just below the aortic bifurcation measures 2.3 x 2.0 cm. There is no pelvic lymphadenopathy. No bone lesions.   MRI abdomen w/wo contrast on 11/13/2019 showed right liver lesion c/w hemangioma. Bilateral adrenal nodules consistent with adenomas. Renal lesions c/w cysts. Retroperitoneal adenopathy is partially imaged. For example a left periaortic lymph node measuring 3.1 x 2.6 cm.  S/P CT-guided biopsy of retroperitoneal lymph node on 11/21/2019. Pathology consistent with a metastatic  poorly-differentiated adenocarcinoma. There is comedonecrosis that can be seen in Gleason pattern 5 prostatic adenocarcinoma or colorectal adenocarcinoma. Immunostains show that the neoplastic cells are positive for NKX-3.1 and CDX-2, negative for CK7 and CK20, confirming the diagnosis of metastatic prostatic adenocarcinoma.    PSA 33.24 ng/ml on 11/29/2019.   Resume ADT with Casodex --> Eligard q88mo by 12/14/2019.   PSA down to 6.99 on 01/01/2020.   Bone scan on 01/01/2020 was negative for e/o bone metastases.   CT abdomen/pelvis on 02/21/2020. Hepatic dome mass: 2.3 x 2.1 cm (302:16), previously 1.9 x 1.7 cm. Right adrenal mass: 3.1 x 1.6 cm (302:46), previously 2.9 x 1.3 cm. Left adrenal mass: 2.0 x 1.3 cm (302:51), previously 1.8 x 1.2 cm. Left periaortic lymph node: 1.8 x 1.3 cm (302:70), previously 2.7 x 2.2 cm. Aortic bifurcation lymph node: 1.7 x 1.0 cm (302:86), previously 2.3 x 2.0 cm. Enhancing mass in hepatic dome appears slightly increased in size. Bilateral adrenal mass are seen also minimally increased in size from prior exam. Multiple complex cysts are seen in the both kidneys. Retroperitoneal lymph nodes are decreased in size.  PSA down to 3.2 on 02/29/2020.   In Oct 2021, offered to start him on a secondary oral antiandrogen, but he declined. Tanner Lewis out of town to [place] for 3 months.   CT c/a/p on 07/11/2020. Para-aortic lymph node: 1.1 x 1.0 cm, previously 1.7 x 1.3 cm. Left common iliac node: 1.6 x 0.5 cm, previously 1.7 x 0.9 cm. There is no suspicious pulmonary nodule, mass or consolidation. A 3 mm nodule in the lingula is stable. A 1.5 x 1.4 cm mildly enlarged AP window lymph node is without significant interval change, previously 1.4 x 1.4 cm. There is no new mediastinal, axillary or hilar lymphadenopathy. A 2.4 cm benign right hepatic hemangioma is stable. No suspicious hepatic mass is noted. There are stable bilateral adrenal nodules, previously characterized as adenomas on MRI from 11/13/2019. A 1.1 x  1.0 cm para-aortic lymph node has slightly decreased in size, previously 1.3 x 1.1 cm. There is no new abdominal/pelvic lymphadenopathy. Prostate radiation seeds are noted. No aggressive osseous lesion is identified.   Bone scan on 07/11/2020 was negative for e/o bone metastases.  PSA up to 6.05 on 07/23/2020. Eligard given on 07/23/2020 (a month later than it was due).  PSA of 7.9 on 10/08/2020; 10.9 on 11/13/2020--> now castrate-resistant  Bone scan on 11/13/2020 was negative for e/o bone metastases.   CT c/a/p on 11/13/2020. No mediastinal, hilar, or axillary lymphadenopathy is identified. Mild fusiform bronchiectasis in portions of both lungs. Scattered mild bilateral linear scarring and/or atelectasis. No hydronephrosis. 2.4 x 1.8 cm hypervascular liver lesion previously felt to represent a hemangioma measured 2.3 x 1.8 cm on the most recent study. Multiple bilateral renal cysts. Peripheral calcifications are again evident along a portion of a stable 3.8 x 3.4 cm exophytic cyst arising from the upper pole region of the right kidney. Stable bilateral adrenal nodules.  No focal lesions are identified in remaining solid abdominal visceral organs. Para-aortic lymph node measures 1.4 x 1.0 cm versus 1.3 x 1.0 cm previously. Stable diffuse urinary bladder wall thickening. Multiple brachytherapy seeds in and adjacent to the prostate gland. No suspicious osseous lesions.  On 11/14/2020, agree to start abiraterone/prednisone. PSA of 10.86 on 11/13/2020.   On 01/12/2021, PSA decreased to 1.26 ng/ml; 0.814 on 02/14/2021.     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     12/14/2019 -  Supportive Therapy    [facility]) - OP Adult - Prostate - Leuprolide (ELIGARD) 45 mg Q 6 Month  Plan Provider: Ronald Lobo, MD  Treatment goal: Control  Line of treatment: [No plan line of treatment]           Past Medical History:  Past Medical History:    Diagnosis Date    Hypertension     Prostate cancer 2010       Patient Active Problem List   Diagnosis    Essential hypertension    History of prostate cancer    Fatigue, unspecified type    Keeps losing balance    Age-related cataract of both eyes, unspecified age-related cataract type    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, bilateral    Prostate cancer - treated with brachytherapy approx 2010, [place]    Nocturia    Weak urinary stream    Rising PSA following treatment for malignant neoplasm of prostate    Right renal mass    Mass of both adrenal glands    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    Stage 3b chronic kidney disease       Medications:  Current Outpatient Medications   Medication Sig Dispense Refill    abiraterone (ZYTIGA) 250 MG tablet Take 4 tablets (1,000 mg total) by mouth daily 120 tablet 5    amLODIPine-benazepril (LOTREL 5-10) 5-10 MG per capsule Take 1 capsule by mouth daily 90 capsule 1    hydrALAZINE (APRESOLINE) 50 MG tablet Take 1 tablet (50 mg total) by mouth 3 (three) times daily Take 1 tablet by mouth three times daily 270 tablet 3    isosorbide dinitrate (ISORDIL) 20 MG tablet Take 1 tablet (20 mg total) by mouth 2 (two) times daily 180 tablet 3    predniSONE (DELTASONE) 5 MG tablet Take 1 tablet (5 mg total) by mouth daily 30 tablet 3    simvastatin (ZOCOR) 20 MG tablet Take 1 tablet (20 mg total) by mouth nightly Take one tablet by mouth at bedtime 90 tablet 3    tamsulosin (FLOMAX) 0.4 MG Cap Take 1 capsule (0.4 mg total) by mouth Daily after dinner Take 1 capsule by mouth daily. 90 capsule 0    trospium (SANCTURA) 20 MG tablet Take 1 tablet (20 mg total) by mouth 2 (two) times daily 60 tablet 3    venlafaxine (EFFEXOR) 75 MG tablet Take 1 tablet (75 mg total) by mouth daily 90 tablet 1     No current facility-administered medications for this visit.       Allergies, Social History, and Family History were reviewed and updated.      Tobacco Dependence:  Social History      Tobacco Use   Smoking Status Former    Packs/day: 1.50    Years: 20.00    Pack years: 30.00    Types: Cigarettes  Quit date: 05/10/2014    Years since quitting: 6.7   Smokeless Tobacco Never       Smoking Cessation Counseling:  Date: February 19, 2021  N/A    Physical Exam:  Vitals:    02/19/21 1011   BP: 126/69   Pulse: 81   Resp: 15   Temp: 97.3 F (36.3 C)   SpO2: 98%     Body surface area is 1.8 meters squared.  ECOG performance status: 0-1  Psychosocial: Appropriate affect. Supportive daughters. Ongoing concerns regarding memory loss and cognitive deficits.    General Appearance:  Alert, cooperative, in no distress, appears stated age  Head:  Normocephalic without obvious abnormalities, atraumatic  Eyes: Intact EOM. No scleral icterus, conjunctiva/corneas clear  Nose: Deferred, wearing mask  Throat:  Deferred, wearing mask  Skin: Normal color and turgor, no rashes or lesions  Neurologic: Cranial nerves grossly intact, no focal motor deficits, normal speech and mentation, ambulates with a cane        Labs:     Recent Results (from the past 336 hour(s))   CBC and differential    Collection Time: 02/14/21  1:15 PM   Result Value Ref Range    WBC 4.69 3.10 - 9.50 x10 3/uL    Hgb 11.8 (L) 12.5 - 17.1 g/dL    Hematocrit 16.1 (L) 37.6 - 49.6 %    Platelets 283 142 - 346 x10 3/uL    RBC 3.85 (L) 4.20 - 5.90 x10 6/uL    MCV 90.6 78.0 - 96.0 fL    MCH 30.6 25.1 - 33.5 pg    MCHC 33.8 31.5 - 35.8 g/dL    RDW 13 11 - 15 %    MPV 8.0 (L) 8.9 - 12.5 fL    Neutrophils 48.7 None %    Lymphocytes Automated 38.8 None %    Monocytes 9.8 None %    Eosinophils Automated 2.1 None %    Basophils Automated 0.4 None %    Immature Granulocytes 0.2 None %    Nucleated RBC 0.0 0.0 - 0.0 /100 WBC    Neutrophils Absolute 2.28 1.10 - 6.33 x10 3/uL    Lymphocytes Absolute Automated 1.82 0.42 - 3.22 x10 3/uL    Monocytes Absolute Automated 0.46 0.21 - 0.85 x10 3/uL    Eosinophils Absolute Automated 0.10 0.00 - 0.44 x10 3/uL     Basophils Absolute Automated 0.02 0.00 - 0.08 x10 3/uL    Immature Granulocytes Absolute 0.01 0.00 - 0.07 x10 3/uL    Absolute NRBC 0.00 0.00 - 0.00 x10 3/uL   Comprehensive metabolic panel    Collection Time: 02/14/21  1:15 PM   Result Value Ref Range    Glucose 90 70 - 100 mg/dL    BUN 09.6 9.0 - 04.5 mg/dL    Creatinine 1.5 0.5 - 1.5 mg/dL    Sodium 409 811 - 914 mEq/L    Potassium 3.9 3.5 - 5.3 mEq/L    Chloride 103 99 - 111 mEq/L    CO2 30 (H) 17 - 29 mEq/L    Calcium 10.3 (H) 7.9 - 10.2 mg/dL    Protein, Total 7.8 6.0 - 8.3 g/dL    Albumin 3.8 3.5 - 5.0 g/dL    AST (SGOT) 30 5 - 41 U/L    ALT 22 0 - 55 U/L    Alkaline Phosphatase 49 37 - 117 U/L    Bilirubin, Total 0.7 0.2 - 1.2 mg/dL    Globulin 4.0 (H)  2.0 - 3.6 g/dL    Albumin/Globulin Ratio 1.0 0.9 - 2.2    Anion Gap 7.0 5.0 - 15.0   PSA    Collection Time: 02/14/21  1:15 PM   Result Value Ref Range    Prostate Specific Antigen, Total 0.814 0.000 - 4.000 ng/mL   Testosterone    Collection Time: 02/14/21  1:15 PM   Result Value Ref Range    Testosterone 3 (L) 241 - 827 ng/dL   GFR    Collection Time: 02/14/21  1:15 PM   Result Value Ref Range    EGFR 54.4    Hemolysis index    Collection Time: 02/14/21  1:15 PM   Result Value Ref Range    Hemolysis Index 7 0 - 24 Index   .    Rads:     No new imaging since last office visit.     Assessment:     1. 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 Androgen deprivation therapy    3. Osteopenia, unspecified location    4. Nocturia    5. Shuffling gait    6. Hypercalcemia        Recommendations:     Adenocarcinoma of the prostate, stage IV (TX N1 M1a), castrate-resistant. Oncologic history dating back to initial diagnosis in 2006 is summarized above. Prior to establishing care with me, he was receiving ADT intermittently for biochemical recurrence, then for nodal metastases. By the time I met him in March 2021 for a rising PSA level, he had been off ADT for at least 2 years and his PSA had risen up  to 10.5 by 08/17/2019. CT c/a/p and MRI abdomen done in May/June 2021 were notable for retroperitoneal adenopathy. CT-guided retroperitoneal LN biopsy on 11/21/2019 confirmed diagnosis of metastatic prostate cancer. Bone scan on 01/01/2020 was negative for e/o bone metastases.  ADT was resumed in July 2021 and he has remained on q39mo Eligard continuously since then. His PSA decreased from 33.2 in July 2021, down to 3.2 on 02/29/2020, consistent with response to ADT but began to rise up to 10.9 by 11/13/2020, consistent with development of castrate resistance. His bone scan on 11/13/2020 was negative for e/o bone metastases and his CT c/a/p on 6/16 showed stable mildly para-aortic lymphadenopathy (measures 1.4 x 1 cm, up slightly from 1.3 x 1 cm). In June 2022, we agreed to start him on Zytiga 1000 mg/d plus prednisone 5 mg/d for his metastatic CRPC but due to side effects, the dose was reduced to 500 mg/d last month. He is tolerating this better and is otherwise clinically stable on exam today. His PSA on 9/17 has decreased further from 1.26 to 0.8 ng/ml, consistent with response to Zytiga. He will otherwise continue Eligard every 6 months (due again in Feb 2023). We agreed on restaging CT c/a/p in a month prior to his next office visit.     Androgen deprivation therapy/Osteopenia. DEXA scan in May 2021 c/w osteopenia of the left femoral neck. Normal BMD in the spine. Continue Ca and vit D supplementation for bone health.     Hypercalcemia. Mild, intermittent. He takes Ca daily and vit D three times per week. I advised him to reduce the Ca to three times a week, as well.     Nocturia. Severe, gets up to urinate at least 20 times per night. He was seen by Dr Andrey Campanile recently and was started on trospium. Has f/u with her again in 2 months.  Shuffling gait. Began soon after starting Zytiga, possibly indicative of generalized muscle weakness. Improving on a lower dose of Zytiga. Continue to monitor.     Miscellaneous. He  plans on spending half the year here and the other half in [place] where he has another daughter. He and his daughter here [name]) state that they intend on all keeping all medical appts and treatments here if possible, but I have advised them to consider establishing care with a medical oncologist in [place] for the times he will be in [place]. He plans on being in [place] again from early Nov to Feb 2023 when he is due for Eligard again.     Follow-up. We agreed for Mr. Biermann to return in one month for labs and office visit. I encouraged the patient and his daughter to call or message me via MyChart with any questions or concerns prior to their next appointment here. They had many relevant questions which I answered to the best of my ability and to their apparent satisfaction. They voiced understanding and agreed to the plan outlined above.       LOS Documentation:    Number and Complexity of Problems Addressed:  1 or more chronic illness with exacerbation, progression or side effects of treatment    Amount and/or Complexity of Data Reviewed:  - Reviewed prior external notes: urology  - Reviewed the following results from 2022: 3+ unique labs  - Ordered the following unique test(s): 3+ unique labs and 1 unique imaging test  - Performed an assessment requiring an independent historian - daughter    Risk of Complications and/or Morbidity or Mortality of Patient Management:  Drug therapy requiring intensive monitoring for toxicity        I spent a total of 45 minutes reviewing the patient's chart, including relevant radiological and laboratory findings, pathology reports, and available outside records, counseling, medical decision making, coordination of care, and documentation for today's encounter.    Disclaimer: This note was dictated with voice recognition software. Similar sounding words can inadvertently be transcribed and may not be corrected upon review.    Signed by:    Serena Croissant, MD  Medical Oncologist   [facility]  [address] [facility] 5th [address]  [place], [place] [address]  Tel: [phone]  Fax: [phone]     Ronald Lobo, MD  9/22/202210:30 AM

## 2021-02-25 ENCOUNTER — Encounter (INDEPENDENT_AMBULATORY_CARE_PROVIDER_SITE_OTHER): Payer: Self-pay | Admitting: Family Medicine

## 2021-03-05 ENCOUNTER — Encounter (INDEPENDENT_AMBULATORY_CARE_PROVIDER_SITE_OTHER): Payer: Self-pay | Admitting: Family Medicine

## 2021-03-05 ENCOUNTER — Other Ambulatory Visit (INDEPENDENT_AMBULATORY_CARE_PROVIDER_SITE_OTHER): Payer: Self-pay | Admitting: Family Medicine

## 2021-03-05 DIAGNOSIS — F32A Depression, unspecified: Secondary | ICD-10-CM

## 2021-03-05 MED ORDER — VENLAFAXINE HCL 75 MG PO TABS
75.0000 mg | ORAL_TABLET | Freq: Every day | ORAL | 0 refills | Status: DC
Start: 2021-03-05 — End: 2021-03-19

## 2021-03-06 ENCOUNTER — Other Ambulatory Visit (INDEPENDENT_AMBULATORY_CARE_PROVIDER_SITE_OTHER): Payer: Self-pay | Admitting: Family Medicine

## 2021-03-06 DIAGNOSIS — F32A Depression, unspecified: Secondary | ICD-10-CM

## 2021-03-18 ENCOUNTER — Other Ambulatory Visit (INDEPENDENT_AMBULATORY_CARE_PROVIDER_SITE_OTHER): Payer: Self-pay | Admitting: Hematology & Oncology

## 2021-03-19 ENCOUNTER — Encounter (INDEPENDENT_AMBULATORY_CARE_PROVIDER_SITE_OTHER): Payer: Self-pay | Admitting: Family Medicine

## 2021-03-19 ENCOUNTER — Ambulatory Visit (INDEPENDENT_AMBULATORY_CARE_PROVIDER_SITE_OTHER): Payer: Medicare Other | Admitting: Family Medicine

## 2021-03-19 ENCOUNTER — Other Ambulatory Visit (FREE_STANDING_LABORATORY_FACILITY): Payer: Medicare Other

## 2021-03-19 VITALS — BP 105/68 | HR 72 | Temp 98.1°F | Resp 18 | Ht 67.0 in | Wt 150.0 lb

## 2021-03-19 DIAGNOSIS — Z79818 Long term (current) use of other agents affecting estrogen receptors and estrogen levels: Secondary | ICD-10-CM

## 2021-03-19 DIAGNOSIS — Z23 Encounter for immunization: Secondary | ICD-10-CM

## 2021-03-19 DIAGNOSIS — C61 Malignant neoplasm of prostate: Secondary | ICD-10-CM

## 2021-03-19 DIAGNOSIS — Z192 Hormone resistant malignancy status: Secondary | ICD-10-CM

## 2021-03-19 DIAGNOSIS — R42 Dizziness and giddiness: Secondary | ICD-10-CM

## 2021-03-19 DIAGNOSIS — C772 Secondary and unspecified malignant neoplasm of intra-abdominal lymph nodes: Secondary | ICD-10-CM

## 2021-03-19 DIAGNOSIS — E782 Mixed hyperlipidemia: Secondary | ICD-10-CM

## 2021-03-19 DIAGNOSIS — F32A Depression, unspecified: Secondary | ICD-10-CM

## 2021-03-19 DIAGNOSIS — I10 Essential (primary) hypertension: Secondary | ICD-10-CM

## 2021-03-19 DIAGNOSIS — M8580 Other specified disorders of bone density and structure, unspecified site: Secondary | ICD-10-CM

## 2021-03-19 DIAGNOSIS — R2689 Other abnormalities of gait and mobility: Secondary | ICD-10-CM

## 2021-03-19 DIAGNOSIS — R351 Nocturia: Secondary | ICD-10-CM

## 2021-03-19 LAB — COMPREHENSIVE METABOLIC PANEL
ALT: 20 U/L (ref 0–55)
AST (SGOT): 25 U/L (ref 5–41)
Albumin/Globulin Ratio: 1 (ref 0.9–2.2)
Albumin: 3.5 g/dL (ref 3.5–5.0)
Alkaline Phosphatase: 73 U/L (ref 37–117)
Anion Gap: 10 (ref 5.0–15.0)
BUN: 24 mg/dL (ref 9.0–28.0)
Bilirubin, Total: 1 mg/dL (ref 0.2–1.2)
CO2: 21 mEq/L (ref 17–29)
Calcium: 9.5 mg/dL (ref 7.9–10.2)
Chloride: 104 mEq/L (ref 99–111)
Creatinine: 1.6 mg/dL — ABNORMAL HIGH (ref 0.5–1.5)
Globulin: 3.6 g/dL (ref 2.0–3.6)
Glucose: 108 mg/dL — ABNORMAL HIGH (ref 70–100)
Potassium: 3.8 mEq/L (ref 3.5–5.3)
Protein, Total: 7.1 g/dL (ref 6.0–8.3)
Sodium: 135 mEq/L (ref 135–145)

## 2021-03-19 LAB — CBC AND DIFFERENTIAL
Absolute NRBC: 0 10*3/uL (ref 0.00–0.00)
Basophils Absolute Automated: 0.02 10*3/uL (ref 0.00–0.08)
Basophils Automated: 0.4 %
Eosinophils Absolute Automated: 0.06 10*3/uL (ref 0.00–0.44)
Eosinophils Automated: 1.3 %
Hematocrit: 33.7 % — ABNORMAL LOW (ref 37.6–49.6)
Hgb: 11.5 g/dL — ABNORMAL LOW (ref 12.5–17.1)
Immature Granulocytes Absolute: 0.02 10*3/uL (ref 0.00–0.07)
Immature Granulocytes: 0.4 %
Lymphocytes Absolute Automated: 1.88 10*3/uL (ref 0.42–3.22)
Lymphocytes Automated: 39.9 %
MCH: 30.9 pg (ref 25.1–33.5)
MCHC: 34.1 g/dL (ref 31.5–35.8)
MCV: 90.6 fL (ref 78.0–96.0)
MPV: 8.7 fL — ABNORMAL LOW (ref 8.9–12.5)
Monocytes Absolute Automated: 0.5 10*3/uL (ref 0.21–0.85)
Monocytes: 10.6 %
Neutrophils Absolute: 2.23 10*3/uL (ref 1.10–6.33)
Neutrophils: 47.4 %
Nucleated RBC: 0 /100 WBC (ref 0.0–0.0)
Platelets: 258 10*3/uL (ref 142–346)
RBC: 3.72 10*6/uL — ABNORMAL LOW (ref 4.20–5.90)
RDW: 13 % (ref 11–15)
WBC: 4.71 10*3/uL (ref 3.10–9.50)

## 2021-03-19 LAB — PSA: Prostate Specific Antigen, Total: 0.935 ng/mL (ref 0.000–4.000)

## 2021-03-19 LAB — TESTOSTERONE: Testosterone: 2 ng/dL — ABNORMAL LOW (ref 241–827)

## 2021-03-19 LAB — HEMOLYSIS INDEX: Hemolysis Index: 25 Index — ABNORMAL HIGH (ref 0–24)

## 2021-03-19 LAB — GFR: EGFR: 50.5

## 2021-03-19 MED ORDER — ISOSORBIDE DINITRATE 20 MG PO TABS
20.0000 mg | ORAL_TABLET | Freq: Two times a day (BID) | ORAL | 3 refills | Status: DC
Start: 2021-03-19 — End: 2021-11-04

## 2021-03-19 MED ORDER — AMLODIPINE BESY-BENAZEPRIL HCL 5-10 MG PO CAPS
1.0000 | ORAL_CAPSULE | Freq: Every day | ORAL | 3 refills | Status: DC
Start: 2021-03-19 — End: 2021-11-04

## 2021-03-19 MED ORDER — HYDRALAZINE HCL 50 MG PO TABS
50.0000 mg | ORAL_TABLET | Freq: Three times a day (TID) | ORAL | 3 refills | Status: DC
Start: 2021-03-19 — End: 2021-09-17

## 2021-03-19 MED ORDER — SIMVASTATIN 20 MG PO TABS
20.0000 mg | ORAL_TABLET | Freq: Every evening | ORAL | 3 refills | Status: DC
Start: 2021-03-19 — End: 2021-11-04

## 2021-03-19 MED ORDER — VENLAFAXINE HCL 75 MG PO TABS
75.0000 mg | ORAL_TABLET | Freq: Every day | ORAL | 3 refills | Status: DC
Start: 2021-03-19 — End: 2021-11-04

## 2021-03-19 MED ORDER — TAMSULOSIN HCL 0.4 MG PO CAPS
0.4000 mg | ORAL_CAPSULE | Freq: Every day | ORAL | 3 refills | Status: DC
Start: 2021-03-19 — End: 2021-11-04

## 2021-03-19 NOTE — Progress Notes (Signed)
Subjective:      Date: 03/19/2021 7:44 AM   Patient ID: Tanner Lewis is a 80 y.o. male.    Chief Complaint:  Chief Complaint   Patient presents with    Depression    Hypertension    Immunizations       HPI:  HPI  Patient is an 80 year old gentleman with a past medical history significant for prostate cancer, currently following with oncology for treatment.  Presents today accompanied by his daughter for medication refill.  He will be going to [place] for the next several months.  No return early next year.    No acute complaints today.         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    Stage 3b chronic kidney disease       Current Medications:  Current Outpatient Medications   Medication Sig Dispense Refill    abiraterone (ZYTIGA) 250 MG tablet Take 4 tablets (1,000 mg total) by mouth daily 120 tablet 5    predniSONE (DELTASONE) 5 MG tablet Take 1 tablet (5 mg total) by mouth daily 30 tablet 3    trospium (SANCTURA) 20 MG tablet Take 1 tablet (20 mg total) by mouth 2 (two) times daily 60 tablet 3    amLODIPine-benazepril (LOTREL 5-10) 5-10 MG per capsule Take 1 capsule by mouth daily 90 capsule 3    hydrALAZINE (APRESOLINE) 50 MG tablet Take 1 tablet (50 mg total) by mouth 3 (three) times daily Take 1 tablet by mouth three times daily 270 tablet 3    isosorbide dinitrate (ISORDIL) 20 MG tablet Take 1 tablet (20 mg total) by mouth 2 (two) times daily 180 tablet 3    simvastatin (ZOCOR) 20 MG tablet Take 1 tablet (20 mg total) by mouth  nightly Take one tablet by mouth at bedtime 90 tablet 3    tamsulosin (FLOMAX) 0.4 MG Cap Take 1 capsule (0.4 mg total) by mouth Daily after dinner Take 1 capsule by mouth daily. 90 capsule 3    venlafaxine (EFFEXOR) 75 MG tablet Take 1 tablet (75 mg total) by mouth daily 90 tablet 3     No current facility-administered medications for this visit.       Allergies:  Allergies   Allergen Reactions    Solarcaine [Benzocaine] Rash     Happened over 5 years ago.       Past Medical History:  Past Medical History:   Diagnosis Date    Hypertension     Prostate cancer 2010       ROS:  Review of Systems       Objective:     Vitals:  BP 105/68 (BP Site: Left arm, Patient Position: Sitting, Cuff Size: Medium)    Pulse 72    Temp 98.1 F (36.7 C) (Temporal)    Resp 18    Ht 1.702 m (5\' 7" )    Wt  68 kg (150 lb)    SpO2 98%    BMI 23.49 kg/m     Physical Exam:  Physical Exam      Assessment/Plan:       1. Depression, unspecified depression type  - venlafaxine (EFFEXOR) 75 MG tablet; Take 1 tablet (75 mg total) by mouth daily  Dispense: 90 tablet; Refill: 3    2. Dizziness  - amLODIPine-benazepril (LOTREL 5-10) 5-10 MG per capsule; Take 1 capsule by mouth daily  Dispense: 90 capsule; Refill: 3    3. Metastatic castration-resistant adenocarcinoma of prostate    4. Androgen deprivation therapy    5. Mixed hyperlipidemia  - simvastatin (ZOCOR) 20 MG tablet; Take 1 tablet (20 mg total) by mouth nightly Take one tablet by mouth at bedtime  Dispense: 90 tablet; Refill: 3    6. Essential hypertension  - hydrALAZINE (APRESOLINE) 50 MG tablet; Take 1 tablet (50 mg total) by mouth 3 (three) times daily Take 1 tablet by mouth three times daily  Dispense: 270 tablet; Refill: 3  - isosorbide dinitrate (ISORDIL) 20 MG tablet; Take 1 tablet (20 mg total) by mouth 2 (two) times daily  Dispense: 180 tablet; Refill: 3    7. Need for vaccination  - Flu vaccine HIGH-DOSE QUAD (PF) 65 yrs and older      -- Reviewed chronic med condition.  Stable.   Continue current medication.  Appropriate refills provided.  He will continue to follow with oncology for management of prostate cancer.  All questions answered.  He will have a safe travel and will follow-up upon his return from [place].    No follow-ups on file.    Theresia Lo Owusu-Boateng, DO

## 2021-03-20 ENCOUNTER — Encounter: Payer: Self-pay | Admitting: Hematology & Oncology

## 2021-03-20 ENCOUNTER — Ambulatory Visit: Payer: Medicare Other

## 2021-03-20 ENCOUNTER — Telehealth: Payer: Self-pay

## 2021-03-20 ENCOUNTER — Other Ambulatory Visit: Payer: Medicare Other

## 2021-03-20 ENCOUNTER — Encounter (INDEPENDENT_AMBULATORY_CARE_PROVIDER_SITE_OTHER): Payer: Self-pay | Admitting: Family Medicine

## 2021-03-20 ENCOUNTER — Ambulatory Visit: Payer: Medicare Other | Attending: Hematology & Oncology | Admitting: Hematology & Oncology

## 2021-03-20 VITALS — BP 123/67 | HR 95 | Temp 97.1°F | Resp 17 | Ht 67.0 in | Wt 149.2 lb

## 2021-03-20 DIAGNOSIS — M8580 Other specified disorders of bone density and structure, unspecified site: Secondary | ICD-10-CM

## 2021-03-20 DIAGNOSIS — C772 Secondary and unspecified malignant neoplasm of intra-abdominal lymph nodes: Secondary | ICD-10-CM | POA: Insufficient documentation

## 2021-03-20 DIAGNOSIS — Z79818 Long term (current) use of other agents affecting estrogen receptors and estrogen levels: Secondary | ICD-10-CM | POA: Insufficient documentation

## 2021-03-20 DIAGNOSIS — S22089A Unspecified fracture of T11-T12 vertebra, initial encounter for closed fracture: Secondary | ICD-10-CM | POA: Insufficient documentation

## 2021-03-20 DIAGNOSIS — C61 Malignant neoplasm of prostate: Secondary | ICD-10-CM

## 2021-03-20 DIAGNOSIS — R351 Nocturia: Secondary | ICD-10-CM

## 2021-03-20 DIAGNOSIS — R2689 Other abnormalities of gait and mobility: Secondary | ICD-10-CM

## 2021-03-20 NOTE — Telephone Encounter (Signed)
Please add C1 of Prolia to patient's Eligard injection appointment on 2/24 at 10:30 am    Provider: Dr. Cathie Hoops  Diagnosis codes: C61, M85.80, Z61.096

## 2021-03-20 NOTE — Progress Notes (Signed)
03/20/21 1000   Pain History   Pain Symptoms Pain   Pain Location Lower Back;Mid Back   Pain Description Sore;Sharp/Stabbing;Spasms/Cramping   Pain Frequency Constant   Sensation Location Mid Back;Lower Back   Sensation Description Tingling;Tightness   Sensation Frequency Constant   Incontinence No   Treatments   Have you visited a chiropractor for this problem? No   Have you ever had physical therapy for this problem? No   Have you ever received an injection for this problem? No   Diagnostic Tests   CT Scan Cervical  (02/2021)   ISP Appointment   ISP Physician Rosemarie Beath  (JESSICA [facility]/ NP)   ISP Appointment Date 04/03/21  (AT 1:00PM)   ISP Appointment Location [facility]

## 2021-03-20 NOTE — Progress Notes (Signed)
[facility] ON[facility]Y FOLLOW-UP NOTE      Date of service: March 20, 2021    Subjective:   Chief complaint: Tanner Lewis is a 80 y.o. year old male who returns for follow-up of metastatic prostate cancer.     History of Present Illness:  Tanner Lewis presents for routine follow-up.  He remains on Eligard q30mo and Zytiga 500 mg/d and prednisone 5 mg/d. He denies any new complaints except for mid-back pain for the past 3 weeks since having a fall. He was trying to change his underwear while standing up and fell when he was trying to balance on one foot. His nocturia remains severe on current meds prescribed by urology. He is accompanied by his daughter again today.       Oncology History:     Oncology History Overview Note   Adenocarcinoma of the prostate, stage IV (TX N1 M1a), castration resistant  Original diagnosis in 2006. Initial PSA was 6.96 ng/ml.   Prostate biopsy on 02/18/05 confirmed a prostatic adenocarcinoma, Gleason 3+3=6, associated high-grade PIN.  S/P 125-I seed implant on 04/25/2005.  Developed biochemical recurrence, treated with ADT (Casodex/Lupron) from Dec 2011 to March 2013.   PSA 2.22 ng/ml on 09/09/2014.  Bone scan on 10/08/14 was negative for e/o bone metastases.   CT abd/pel on 10/08/14 showed "stable bilateral adrenal nodules and multiple prominent retroperitoneal adenopathy with the largest measuring 1.6 x 1.1 cm in the right iliac chain. There was also a new enlarged lymph node along the right pelvic sidewall measuring 1.4 x 1 cm. The retroperitoneal lymph nodes appear to be stable in appearance. The max dimension of the largest lymph node is 1.6 x 0.8 cm.   MRI prostate on 11/13/14 demonstrated a local recurrence in the left lobe of the prostate measuring 2.6 x 1.5 x 1.6 cm with deformation of the prostate capsule and likely extracapsular spread. There was a 1.5 x 1.2 cm right iliac chain lymph node, a 1.3 x 1.3 cm common iliac chain lymph node, additional smaller right iliac chain lymph  nodes and right common iliac chain lymph nodes.   Began ADT with Lupron q6 mo again on 11/25/2014.   PSA on 11/25/14 was 33.67 ng/ml; 2.18 ng/ml on 02/05/15; 1.65 in Nov 2016; 1.34 on 05/30/15.   Patient was lost to follow-up and did not receive another dose of Lupron since June 2016.  CT chest on 07/24/15 negative for e/o metastatic disease.   Lupron was resumed on 12/21/16 and stopped again sometime in 2019.   PSA 1.62 on 06/08/17; 1.4 ng/ml on 06/20/17;   Established care with local PCP on 05/11/2019 and a PSA was found to be 3.217 ng/ml. Referred to urology for urinary frequency.   Renal US on 08/08/2019 was unremarkable.  Establish care with med oncology on 08/16/2019. PSA of 10.51 ng/ml on 08/17/2019.   CT c/a/p on 10/22/2019 showed a borderline enlarged AP window node which measures 1.4 x 1.4 cm. Indeterminate enhancing lesion in the right hepatic dome measures 1.9 x 1.7 cm. Indeterminate bilateral adrenal masses, 2.9 x 1.4 cm on right and 1.8 x 1.2 cm on left. Bilateral renal cysts noted, including a complex cyst in the upper pole right kidney which measures 4.2 x 3.0 cm, containing either internal septation or internal vessel. There is a more simple appearing cyst in the mid left kidney which measures 4.9 x 4.3 cm. Bladder wall mildly thickened. Multiple prostatic radiation seeds noted. Unremarkable seminal vesicles. There is bulky abdominal lymphadenopathy. A  left periaortic lymph node measures 2.7 x 2.2 cm. Retroaortic lymph node just below the aortic bifurcation measures 2.3 x 2.0 cm. There is no pelvic lymphadenopathy. No bone lesions.   MRI abdomen w/wo contrast on 11/13/2019 showed right liver lesion c/w hemangioma. Bilateral adrenal nodules consistent with adenomas. Renal lesions c/w cysts. Retroperitoneal adenopathy is partially imaged. For example a left periaortic lymph node measuring 3.1 x 2.6 cm.  S/P CT-guided biopsy of retroperitoneal lymph node on 11/21/2019. Pathology consistent with a metastatic  poorly-differentiated adenocarcinoma. There is comedonecrosis that can be seen in Gleason pattern 5 prostatic adenocarcinoma or colorectal adenocarcinoma. Immunostains show that the neoplastic cells are positive for NKX-3.1 and CDX-2, negative for CK7 and CK20, confirming the diagnosis of metastatic prostatic adenocarcinoma.    PSA 33.24 ng/ml on 11/29/2019.   Resume ADT with Casodex --> Eligard q40mo by 12/14/2019.   PSA down to 6.99 on 01/01/2020.   Bone scan on 01/01/2020 was negative for e/o bone metastases.   CT abdomen/pelvis on 02/21/2020. Hepatic dome mass: 2.3 x 2.1 cm (302:16), previously 1.9 x 1.7 cm. Right adrenal mass: 3.1 x 1.6 cm (302:46), previously 2.9 x 1.3 cm. Left adrenal mass: 2.0 x 1.3 cm (302:51), previously 1.8 x 1.2 cm. Left periaortic lymph node: 1.8 x 1.3 cm (302:70), previously 2.7 x 2.2 cm. Aortic bifurcation lymph node: 1.7 x 1.0 cm (302:86), previously 2.3 x 2.0 cm. Enhancing mass in hepatic dome appears slightly increased in size. Bilateral adrenal mass are seen also minimally increased in size from prior exam. Multiple complex cysts are seen in the both kidneys. Retroperitoneal lymph nodes are decreased in size.  PSA down to 3.2 on 02/29/2020.   In Oct 2021, offered to start him on a secondary oral antiandrogen, but he declined. Tanner Lewis out of town to [place] for 3 months.   CT c/a/p on 07/11/2020. Para-aortic lymph node: 1.1 x 1.0 cm, previously 1.7 x 1.3 cm. Left common iliac node: 1.6 x 0.5 cm, previously 1.7 x 0.9 cm. There is no suspicious pulmonary nodule, mass or consolidation. A 3 mm nodule in the lingula is stable. A 1.5 x 1.4 cm mildly enlarged AP window lymph node is without significant interval change, previously 1.4 x 1.4 cm. There is no new mediastinal, axillary or hilar lymphadenopathy. A 2.4 cm benign right hepatic hemangioma is stable. No suspicious hepatic mass is noted. There are stable bilateral adrenal nodules, previously characterized as adenomas on MRI from 11/13/2019. A 1.1 x  1.0 cm para-aortic lymph node has slightly decreased in size, previously 1.3 x 1.1 cm. There is no new abdominal/pelvic lymphadenopathy. Prostate radiation seeds are noted. No aggressive osseous lesion is identified.   Bone scan on 07/11/2020 was negative for e/o bone metastases.  PSA up to 6.05 on 07/23/2020. Eligard given on 07/23/2020 (a month later than it was due).  PSA of 7.9 on 10/08/2020; 10.9 on 11/13/2020--> now castrate-resistant  Bone scan on 11/13/2020 was negative for e/o bone metastases.   CT c/a/p on 11/13/2020. No mediastinal, hilar, or axillary lymphadenopathy is identified. Mild fusiform bronchiectasis in portions of both lungs. Scattered mild bilateral linear scarring and/or atelectasis. No hydronephrosis. 2.4 x 1.8 cm hypervascular liver lesion previously felt to represent a hemangioma measured 2.3 x 1.8 cm on the most recent study. Multiple bilateral renal cysts. Peripheral calcifications are again evident along a portion of a stable 3.8 x 3.4 cm exophytic cyst arising from the upper pole region of the right kidney. Stable bilateral adrenal nodules. No focal  lesions are identified in remaining solid abdominal visceral organs. Para-aortic lymph node measures 1.4 x 1.0 cm versus 1.3 x 1.0 cm previously. Stable diffuse urinary bladder wall thickening. Multiple brachytherapy seeds in and adjacent to the prostate gland. No suspicious osseous lesions.  On 11/14/2020, agree to start abiraterone/prednisone. PSA of 10.86 on 11/13/2020.   On 01/12/2021, PSA decreased to 1.26 ng/ml; 0.814 on 02/14/2021.   CT c/a/p with IV contrast on 03/18/2021. Left para-aortic lymph node measures 1.4 x 0.9 cm, previously 1.4 x 1.0 cm. No suspicious lung nodules. Small foci of peripheral scarring. No lymphadenopathy in the chest. No new lymphadenopathy. Radiotherapy seeds are seen in and around the prostate. The bladder wall has normal thickness. Hypervascular liver lesion in the right hepatic lobe in segment 8 measuring 2.5 cm,  previously determined to be a benign hemangioma. No suspicious liver lesions. Somewhat nodular adrenals, unchanged from prior exams. Mildly complex upper pole right renal cyst containing thin partially calcified internal septation, unchanged. Multiple benign simple renal cysts. No hydronephrosis. No renal masses. There is a T12 vertebral body fracture that is new from previous exam in June, likely non-pathologic.  PSA of 0.935 on 03/19/2021.     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     12/14/2019 -  Supportive Therapy    [facility] - OP Adult - Prostate - Leuprolide (ELIGARD) 45 mg Q 6 Month  Plan Provider: Ronald Lobo, MD  Treatment goal: Control  Line of treatment: [No plan line of treatment]       07/24/2021 -  Supportive Therapy    [facility] - OP Adult - On ADT for prostate cancer, osteopenia - Denosumab Q 6 Months (PROLIA)  Plan Provider: Ronald Lobo, MD  Treatment goal: Supportive  Line of treatment: First Line           Past Medical History:  Past Medical History:   Diagnosis Date    Hypertension     Prostate cancer 2010       Patient Active Problem List   Diagnosis    Essential hypertension    History of prostate cancer    Fatigue, unspecified type    Keeps losing balance    Age-related cataract of both eyes, unspecified age-related cataract type    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, bilateral    Malignant neoplasm of prostate    Nocturia    Weak urinary stream    Rising PSA following treatment for malignant neoplasm of prostate    Renal mass    Mass of both adrenal glands    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    Stage 3b chronic kidney disease    Androgen deprivation therapy    Osteopenia, unspecified location       Medications:  Current Outpatient Medications   Medication Sig Dispense Refill    abiraterone (ZYTIGA) 250 MG tablet Take 4 tablets (1,000 mg total) by mouth daily 120 tablet 5     amLODIPine-benazepril (LOTREL 5-10) 5-10 MG per capsule Take 1 capsule by mouth daily 90 capsule 3    hydrALAZINE (APRESOLINE) 50 MG tablet Take 1 tablet (50 mg total) by mouth 3 (three) times daily Take 1 tablet by mouth three times daily 270 tablet 3    isosorbide dinitrate (ISORDIL) 20 MG tablet Take 1 tablet (20 mg total) by mouth 2 (two) times daily 180 tablet 3    predniSONE (DELTASONE)  5 MG tablet Take 1 tablet (5 mg total) by mouth daily 30 tablet 3    simvastatin (ZOCOR) 20 MG tablet Take 1 tablet (20 mg total) by mouth nightly Take one tablet by mouth at bedtime 90 tablet 3    tamsulosin (FLOMAX) 0.4 MG Cap Take 1 capsule (0.4 mg total) by mouth Daily after dinner Take 1 capsule by mouth daily. 90 capsule 3    trospium (SANCTURA) 20 MG tablet Take 1 tablet (20 mg total) by mouth 2 (two) times daily 60 tablet 3    venlafaxine (EFFEXOR) 75 MG tablet Take 1 tablet (75 mg total) by mouth daily 90 tablet 3     No current facility-administered medications for this visit.       Allergies, Social History, and Family History were reviewed and updated.      Tobacco Dependence:  Social History     Tobacco Use   Smoking Status Former    Packs/day: 1.50    Years: 20.00    Pack years: 30.00    Types: Cigarettes    Quit date: 05/10/2014    Years since quitting: 6.8   Smokeless Tobacco Never       Smoking Cessation Counseling:  Date: March 20, 2021  N/A    Physical Exam:  Vitals:    03/20/21 0945   BP: 123/67   Pulse: 95   Resp: 17   Temp: 97.1 F (36.2 C)   SpO2: 98%       Body surface area is 1.79 meters squared.  ECOG performance status: 0-1  Psychosocial: Appropriate affect. Supportive daughters. Ongoing concerns regarding memory loss and cognitive deficits.    General Appearance:  Alert, cooperative, in no distress, appears stated age  Head:  Normocephalic without obvious abnormalities, atraumatic  Eyes: Intact EOM. No scleral icterus, conjunctiva/corneas clear  Nose: Deferred, wearing mask  Throat:  Deferred,  wearing mask  Neck: Supple, symmetrical, trachea midline  Lungs: Clear to auscultation bilaterally, respirations are unlabored, excursion symmetrical  Heart: Regular rate and rhythm, S1 and S2 normal, no murmurs, no tachycardia  Abdomen: Soft, non-tender, non-distended, bowel sounds normoactive  Extremities: No cyanosis or edema  Skin: Normal color and turgor, no rashes or lesions  Neurologic: Cranial nerves grossly intact, no focal motor deficits, normal speech and mentation    Labs:     Recent Results (from the past 336 hour(s))   CBC and differential    Collection Time: 03/19/21  3:50 PM   Result Value Ref Range    WBC 4.71 3.10 - 9.50 x10 3/uL    Hgb 11.5 (L) 12.5 - 17.1 g/dL    Hematocrit 54.0 (L) 37.6 - 49.6 %    Platelets 258 142 - 346 x10 3/uL    RBC 3.72 (L) 4.20 - 5.90 x10 6/uL    MCV 90.6 78.0 - 96.0 fL    MCH 30.9 25.1 - 33.5 pg    MCHC 34.1 31.5 - 35.8 g/dL    RDW 13 11 - 15 %    MPV 8.7 (L) 8.9 - 12.5 fL    Neutrophils 47.4 None %    Lymphocytes Automated 39.9 None %    Monocytes 10.6 None %    Eosinophils Automated 1.3 None %    Basophils Automated 0.4 None %    Immature Granulocytes 0.4 None %    Nucleated RBC 0.0 0.0 - 0.0 /100 WBC    Neutrophils Absolute 2.23 1.10 - 6.33 x10 3/uL    Lymphocytes Absolute  Automated 1.88 0.42 - 3.22 x10 3/uL    Monocytes Absolute Automated 0.50 0.21 - 0.85 x10 3/uL    Eosinophils Absolute Automated 0.06 0.00 - 0.44 x10 3/uL    Basophils Absolute Automated 0.02 0.00 - 0.08 x10 3/uL    Immature Granulocytes Absolute 0.02 0.00 - 0.07 x10 3/uL    Absolute NRBC 0.00 0.00 - 0.00 x10 3/uL   Comprehensive metabolic panel    Collection Time: 03/19/21  3:50 PM   Result Value Ref Range    Glucose 108 (H) 70 - 100 mg/dL    BUN 16.1 9.0 - 09.6 mg/dL    Creatinine 1.6 (H) 0.5 - 1.5 mg/dL    Sodium 045 409 - 811 mEq/L    Potassium 3.8 3.5 - 5.3 mEq/L    Chloride 104 99 - 111 mEq/L    CO2 21 17 - 29 mEq/L    Calcium 9.5 7.9 - 10.2 mg/dL    Protein, Total 7.1 6.0 - 8.3 g/dL    Albumin  3.5 3.5 - 5.0 g/dL    AST (SGOT) 25 5 - 41 U/L    ALT 20 0 - 55 U/L    Alkaline Phosphatase 73 37 - 117 U/L    Bilirubin, Total 1.0 0.2 - 1.2 mg/dL    Globulin 3.6 2.0 - 3.6 g/dL    Albumin/Globulin Ratio 1.0 0.9 - 2.2    Anion Gap 10.0 5.0 - 15.0   PSA    Collection Time: 03/19/21  3:50 PM   Result Value Ref Range    Prostate Specific Antigen, Total 0.935 0.000 - 4.000 ng/mL   Testosterone    Collection Time: 03/19/21  3:50 PM   Result Value Ref Range    Testosterone 2 (L) 241 - 827 ng/dL   GFR    Collection Time: 03/19/21  3:50 PM   Result Value Ref Range    EGFR 50.5    Hemolysis index    Collection Time: 03/19/21  3:50 PM   Result Value Ref Range    Hemolysis Index 25 (H) 0 - 24 Index       Rads:     CT c/a/p with IV contrast on 03/18/2021. Left para-aortic lymph node measures 1.4 x 0.9 cm, previously 1.4 x 1.0 cm. No suspicious lung nodules. Small foci of peripheral scarring. No lymphadenopathy in the chest.  No new lymphadenopathy. Radiotherapy seeds are seen in and around the prostate. The bladder wall has normal thickness. Hypervascular liver lesion in the right hepatic lobe in segment 8 measuring 2.5 cm, previously determined to be a benign hemangioma. No suspicious liver lesions. Somewhat nodular adrenals, unchanged from prior exams. Mildly complex upper pole right renal cyst containing thin partially calcified internal septation, unchanged. Multiple benign simple renal cysts. No hydronephrosis. No renal masses. There is a T12 vertebral body fracture that is new from previous exam in June, likely non-pathologic.    Assessment:     1. 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 Androgen deprivation therapy    3. Osteopenia, unspecified location    4. Hypercalcemia    5. Nocturia    6. Shuffling gait    7. Closed fracture of twelfth thoracic vertebra, unspecified fracture morphology, initial encounter      Recommendations:     Adenocarcinoma of the prostate, stage IV (TX N1 M1a),  castrate-resistant. Oncologic history dating back to initial diagnosis in 2006 is summarized above. Prior to establishing care with me, he was receiving ADT  intermittently for biochemical recurrence, then for nodal metastases. By the time I met him in March 2021 for a rising PSA level, he had been off ADT for at least 2 years and his PSA had risen up to 10.5 by 08/17/2019. CT c/a/p and MRI abdomen done in May/June 2021 were notable for retroperitoneal adenopathy. CT-guided retroperitoneal LN biopsy on 11/21/2019 confirmed diagnosis of metastatic prostate cancer. Bone scan on 01/01/2020 was negative for e/o bone metastases.  ADT was resumed in July 2021 and he has remained on q76mo Eligard continuously since then. His PSA decreased from 33.2 in July 2021, down to 3.2 on 02/29/2020, consistent with response to ADT but began to rise up to 10.9 by 11/13/2020, consistent with development of castrate resistance. His bone scan on 11/13/2020 was negative for e/o bone metastases and his CT c/a/p on 6/16 showed stable mildly para-aortic lymphadenopathy (measures 1.4 x 1 cm, up slightly from 1.3 x 1 cm). In June 2022, we agreed to start him on Zytiga 1000 mg/d plus prednisone 5 mg/d for his metastatic CRPC but due to side effects, the dose was reduced to 500 mg/d in August 2022. He continues to treatment reasonably well with no acute toxicities over the last month. His PSA on 10/20 was slightly up from 0.81 in Sept to 0.935 ng/ml. Restaging CT c/a/p on 03/18/2021 was consistent with stable disease. A new T12 fracture noted, likely due to recent fall (see below for further mgmt) and non-pathologic. He will otherwise continue Eligard every 6 months (due again in Feb 2023). He will be leaving for [place] in mid-Nov and returning on 2/24 for next appt. Lab orders to be drawn in Dec 2022 locally in [place] will be sent to him via MyChart.    Androgen deprivation therapy/Osteopenia. DEXA scan in May 2021 c/w osteopenia of the left femoral neck.  Normal BMD in the spine. However, in light of recent fracture, we agreed to start him on Prolia in Feb 2023. He has dentures and denies any gum/jaw pain. We agreed to defer dental clearance given low risk of ONJ.     Hypercalcemia. Mild, intermittent. Off Ca supplementation, on vit D every other day.     Nocturia. Severe, gets up to urinate at least 20 times per night. Managed by Dr Andrey Campanile. He needs clarification of his medications so agreed to reach out to Dr Andrey Campanile re: this.     T12 fracture. S/P fall about 3 weeks ago. Ordered T-spine MRI and referral to spine specialist today.     Miscellaneous. He plans on spending half the year here and the other half in [place] where he has another daughter. He and his daughter here [name]) state that they intend on all keeping all medical appts and treatments here if possible, but I have advised them to consider establishing care with a medical oncologist in [place] for the times he will be in [place]. He plans on being in [place] again from early Nov to Feb 2023 when he is due for Eligard again.     Follow-up. We agreed for Tanner Lewis on 07/24/2022 for labs, office visit, Eligard, and Prolia. I encouraged the patient and his daughter to call or message me via MyChart with any questions or concerns prior to their next appointment here. They had many relevant questions which I answered to the best of my ability and to their apparent satisfaction. They voiced understanding and agreed to the plan outlined above.       LOS  Documentation:    Number and Complexity of Problems Addressed:  1 or more chronic illness with exacerbation, progression or side effects of treatment    Amount and/or Complexity of Data Reviewed:  - Reviewed the following results from 2022: 3+ unique labs and 1 unique imaging test  - Ordered the following unique test(s): 3+ unique labs and 1 unique imaging test  - Performed an assessment requiring an independent historian - daughter    Risk of Complications  and/or Morbidity or Mortality of Patient Management:  Drug therapy requiring intensive monitoring for toxicity        I spent a total of 45 minutes reviewing the patient's chart, including relevant radiological and laboratory findings, pathology reports, and available outside records, counseling, medical decision making, coordination of care, and documentation for today's encounter.    Disclaimer: This note was dictated with voice recognition software. Similar sounding words can inadvertently be transcribed and may not be corrected upon review.    Signed by:    Serena Croissant, MD  Medical Oncologist   [facility]  [address], [facility] 5th Floor  [place], [place] [address]  Tel: [phone]  Fax: [phone]     Ronald Lobo, MD  10/21/202210:05 AM

## 2021-03-20 NOTE — Telephone Encounter (Signed)
Left message for patient regarding referral to a spine specialist. Appointment scheduled with Rondel Oh, NP on 11/4 at 1 pm. Provided location of appointment. Suggested for patient to contact the clinic with any questions or concerns.

## 2021-03-20 NOTE — Telephone Encounter (Signed)
Spoke with Blake Divine at the [facility]. Scheduled patient for an appointment with Rondel Oh, NP on 11/4 at 1 pm.     [address]  [place], [place] [address]    Will notify patient of appointment details.

## 2021-03-22 ENCOUNTER — Other Ambulatory Visit: Payer: Self-pay | Admitting: Hematology & Oncology

## 2021-03-22 ENCOUNTER — Ambulatory Visit
Admission: RE | Admit: 2021-03-22 | Discharge: 2021-03-22 | Disposition: A | Discharge status: Home / Self Care | Payer: Medicare Other | Source: Ambulatory Visit | Attending: Hematology & Oncology | Admitting: Hematology & Oncology

## 2021-03-22 DIAGNOSIS — S22089A Unspecified fracture of T11-T12 vertebra, initial encounter for closed fracture: Secondary | ICD-10-CM | POA: Insufficient documentation

## 2021-03-22 DIAGNOSIS — X58XXXA Exposure to other specified factors, initial encounter: Secondary | ICD-10-CM | POA: Insufficient documentation

## 2021-03-22 MED ORDER — GADOBUTROL 1 MMOL/ML IV SOSY (WRAP)
6.0000 mL | Freq: Once | INTRAVENOUS | Status: AC | PRN
Start: 2021-03-22 — End: 2021-03-22
  Administered 2021-03-22: 11:00:00 6 mL via INTRAMUSCULAR
  Filled 2021-03-22: qty 7.5

## 2021-03-25 ENCOUNTER — Telehealth: Payer: Self-pay

## 2021-03-25 NOTE — Telephone Encounter (Signed)
Spoke with patient's daughter, [name]. Informed her that Dr. Cathie Hoops reviewed patient's MRI thoracic spine. The scan showed a fracture in his thoracic spine, but it is not related to his cancer. Dr. Cathie Hoops advises that the fracture is likely related to his recent fall. Notified patient's daughter that patient has an appointment with a spine specialist, Rondel Oh, NP, at [facility], [place], [place] [address]) on 11/4 at 1 pm; provided number to reschedule appointment. Patient's daughter agreed and verbalized understanding.

## 2021-03-26 ENCOUNTER — Encounter: Payer: Self-pay | Admitting: Hematology & Oncology

## 2021-03-31 ENCOUNTER — Telehealth (INDEPENDENT_AMBULATORY_CARE_PROVIDER_SITE_OTHER): Payer: Self-pay | Admitting: Neurological Surgery

## 2021-03-31 NOTE — Telephone Encounter (Addendum)
LVM for patient's daughter letting her know per coordinator Dr. Deloria Lair is booked and does not have anything before patient leaves for [place] on 11/13. Informed her we can check and see what our other [facility] providers have. Left main number to call back.

## 2021-04-01 ENCOUNTER — Ambulatory Visit (INDEPENDENT_AMBULATORY_CARE_PROVIDER_SITE_OTHER): Payer: Medicare Other

## 2021-04-03 ENCOUNTER — Ambulatory Visit (INDEPENDENT_AMBULATORY_CARE_PROVIDER_SITE_OTHER): Payer: Medicare Other | Admitting: Nurse Practitioner

## 2021-05-05 ENCOUNTER — Telehealth (INDEPENDENT_AMBULATORY_CARE_PROVIDER_SITE_OTHER): Payer: Self-pay

## 2021-05-05 NOTE — Telephone Encounter (Signed)
Faxed Signed Written Order for refill Trospium

## 2021-05-09 ENCOUNTER — Encounter (INDEPENDENT_AMBULATORY_CARE_PROVIDER_SITE_OTHER): Payer: Self-pay

## 2021-05-20 ENCOUNTER — Encounter: Payer: Self-pay | Admitting: Hematology & Oncology

## 2021-05-20 ENCOUNTER — Encounter (INDEPENDENT_AMBULATORY_CARE_PROVIDER_SITE_OTHER): Payer: Self-pay | Admitting: Family Medicine

## 2021-05-20 LAB — COMPREHENSIVE METABOLIC PANEL
ALT: 19 IU/L (ref 0–44)
AST (SGOT): 25 IU/L (ref 0–40)
Albumin/Globulin Ratio: 1.3 (ref 1.2–2.2)
Albumin: 4.1 g/dL (ref 3.7–4.7)
Alkaline Phosphatase: 54 IU/L (ref 44–121)
BUN / Creatinine Ratio: 10 (ref 10–24)
BUN: 19 mg/dL (ref 8–27)
Bilirubin, Total: 0.8 mg/dL (ref 0.0–1.2)
CO2: 25 mmol/L (ref 20–29)
Calcium: 9.7 mg/dL (ref 8.6–10.2)
Chloride: 101 mmol/L (ref 96–106)
Creatinine: 1.82 mg/dL — ABNORMAL HIGH (ref 0.76–1.27)
Globulin, Total: 3.1 g/dL (ref 1.5–4.5)
Glucose: 102 mg/dL — ABNORMAL HIGH (ref 70–99)
Potassium: 3.8 mmol/L (ref 3.5–5.2)
Protein, Total: 7.2 g/dL (ref 6.0–8.5)
Sodium: 141 mmol/L (ref 134–144)
eGFR: 37 mL/min/{1.73_m2} — ABNORMAL LOW (ref >59)

## 2021-05-20 LAB — TESTOSTERONE: Testosterone: <3 ng/dL — ABNORMAL LOW (ref 264–916)

## 2021-05-20 LAB — CBC AND DIFFERENTIAL
Baso(Absolute): 0 10*3/uL (ref 0.0–0.2)
Basophils Automated: 1 %
Eosinophils Absolute: 0.1 10*3/uL (ref 0.0–0.4)
Eosinophils Automated: 2 %
Hematocrit: 36.2 % — ABNORMAL LOW (ref 37.5–51.0)
Hemoglobin: 12.2 g/dL — ABNORMAL LOW (ref 13.0–17.7)
Immature Granulocytes Absolute: 0 10*3/uL (ref 0.0–0.1)
Immature Granulocytes: 0 %
Lymphocytes Absolute: 1.5 10*3/uL (ref 0.7–3.1)
Lymphocytes Automated: 35 %
MCH: 31 pg (ref 26.6–33.0)
MCHC: 33.7 g/dL (ref 31.5–35.7)
MCV: 92 fL (ref 79–97)
Monocytes Absolute: 0.5 10*3/uL (ref 0.1–0.9)
Monocytes: 12 %
Neutrophils Absolute Count: 2.2 10*3/uL (ref 1.4–7.0)
Neutrophils: 50 %
Platelets: 226 10*3/uL (ref 150–450)
RBC: 3.94 x10E6/uL — ABNORMAL LOW (ref 4.14–5.80)
RDW: 13.7 % (ref 11.6–15.4)
WBC: 4.4 10*3/uL (ref 3.4–10.8)

## 2021-05-21 ENCOUNTER — Other Ambulatory Visit: Payer: Self-pay

## 2021-05-21 MED ORDER — ABIRATERONE ACETATE 250 MG PO TABS
1000.0000 mg | ORAL_TABLET | Freq: Every day | ORAL | 5 refills | Status: DC
Start: 2021-05-21 — End: 2021-07-24

## 2021-05-21 NOTE — Telephone Encounter (Signed)
Requesting a new script to be sent to [facility] to evaluate for patient assitance program.

## 2021-05-24 LAB — PSA: Prostate Specific Antigen, Total: 2.828 ng/mL

## 2021-05-26 ENCOUNTER — Telehealth: Payer: Self-pay

## 2021-05-26 ENCOUNTER — Other Ambulatory Visit (INDEPENDENT_AMBULATORY_CARE_PROVIDER_SITE_OTHER): Payer: Self-pay | Admitting: Family Medicine

## 2021-05-26 DIAGNOSIS — R2689 Other abnormalities of gait and mobility: Secondary | ICD-10-CM

## 2021-05-26 NOTE — Telephone Encounter (Signed)
Reviewed with Dr. Cathie Hoops. Please assist patient with scheduling a lab appointment and office visit in mid-to-late January. Orders in Epic.

## 2021-06-02 ENCOUNTER — Telehealth: Payer: Self-pay

## 2021-06-02 ENCOUNTER — Other Ambulatory Visit: Payer: Self-pay

## 2021-06-02 DIAGNOSIS — C61 Malignant neoplasm of prostate: Secondary | ICD-10-CM

## 2021-06-02 DIAGNOSIS — Z79818 Long term (current) use of other agents affecting estrogen receptors and estrogen levels: Secondary | ICD-10-CM

## 2021-06-02 NOTE — Telephone Encounter (Signed)
Spoke with patient's daughter, [name]. Informed her that Dr. Cathie Hoops reviewed patient's recent lab results, and his PSA more than doubled since October 2022. Dr. Cathie Hoops recommends for patient to have a CT chest, abdomen, pelvis and a bone scan prior to his upcoming office visit on 1/20 (if possible). Patient's daughter states patient is returning to the area on 06/15/21. Will contact Patient Access Team to assist with scheduling scans. Patient's daughter agreed and verbalized understanding.

## 2021-06-02 NOTE — Telephone Encounter (Signed)
Reviewed with Dr. Cathie Hoops. Please assist patient's daughter with scheduling a CT chest, abdomen, pelvis with IV contrast and a bone scan the week of 1/16 (if possible). Orders in Epic.

## 2021-06-12 ENCOUNTER — Other Ambulatory Visit (INDEPENDENT_AMBULATORY_CARE_PROVIDER_SITE_OTHER): Payer: Self-pay | Admitting: Urology

## 2021-06-12 DIAGNOSIS — R351 Nocturia: Secondary | ICD-10-CM

## 2021-06-12 MED ORDER — TROSPIUM CHLORIDE 20 MG PO TABS
20.0000 mg | ORAL_TABLET | Freq: Two times a day (BID) | ORAL | 3 refills | Status: DC
Start: 2021-06-12 — End: 2021-11-04

## 2021-06-17 ENCOUNTER — Other Ambulatory Visit: Payer: Self-pay | Admitting: Hematology & Oncology

## 2021-06-17 ENCOUNTER — Ambulatory Visit
Admission: RE | Admit: 2021-06-17 | Discharge: 2021-06-17 | Disposition: A | Discharge status: Home / Self Care | Payer: Medicare Other | Source: Ambulatory Visit | Attending: Hematology & Oncology | Admitting: Hematology & Oncology

## 2021-06-17 DIAGNOSIS — C772 Secondary and unspecified malignant neoplasm of intra-abdominal lymph nodes: Secondary | ICD-10-CM

## 2021-06-17 DIAGNOSIS — Z79818 Long term (current) use of other agents affecting estrogen receptors and estrogen levels: Secondary | ICD-10-CM

## 2021-06-17 DIAGNOSIS — C61 Malignant neoplasm of prostate: Secondary | ICD-10-CM | POA: Insufficient documentation

## 2021-06-17 DIAGNOSIS — N3040 Irradiation cystitis without hematuria: Secondary | ICD-10-CM | POA: Insufficient documentation

## 2021-06-17 LAB — WHOLE BLOOD CREATININE WITH GFR POCT
GFR POCT: >60 mL/min/{1.73_m2} (ref >60)
Whole Blood Creatinine POCT: 1.4 mg/dL — ABNORMAL HIGH (ref 0.5–1.1)

## 2021-06-17 MED ORDER — IOHEXOL 350 MG/ML IV SOLN
100.0000 mL | Freq: Once | INTRAVENOUS | Status: AC | PRN
Start: 2021-06-17 — End: 2021-06-17
  Administered 2021-06-17: 100 mL via INTRAVENOUS

## 2021-06-17 MED ORDER — TECHNETIUM TC 99M MEDRONATE INJECTION
26.0000 | Freq: Once | Status: AC | PRN
Start: 2021-06-17 — End: 2021-06-17
  Administered 2021-06-17: 26 via INTRAVENOUS

## 2021-06-19 ENCOUNTER — Ambulatory Visit: Payer: Medicare Other | Attending: Hematology & Oncology | Admitting: Hematology & Oncology

## 2021-06-19 ENCOUNTER — Encounter: Payer: Self-pay | Admitting: Hematology & Oncology

## 2021-06-19 ENCOUNTER — Other Ambulatory Visit: Payer: Medicare Other

## 2021-06-19 VITALS — BP 126/74 | HR 57 | Temp 97.1°F | Resp 20 | Ht 67.0 in | Wt 138.0 lb

## 2021-06-19 DIAGNOSIS — C772 Secondary and unspecified malignant neoplasm of intra-abdominal lymph nodes: Secondary | ICD-10-CM | POA: Insufficient documentation

## 2021-06-19 DIAGNOSIS — S22080S Wedge compression fracture of T11-T12 vertebra, sequela: Secondary | ICD-10-CM | POA: Insufficient documentation

## 2021-06-19 DIAGNOSIS — Z79818 Long term (current) use of other agents affecting estrogen receptors and estrogen levels: Secondary | ICD-10-CM | POA: Insufficient documentation

## 2021-06-19 DIAGNOSIS — M8580 Other specified disorders of bone density and structure, unspecified site: Secondary | ICD-10-CM | POA: Insufficient documentation

## 2021-06-19 DIAGNOSIS — C61 Malignant neoplasm of prostate: Secondary | ICD-10-CM | POA: Insufficient documentation

## 2021-06-19 NOTE — Progress Notes (Signed)
[facility] ON[facility]Y FOLLOW-UP NOTE      Date of service: June 19, 2021    Subjective:   Chief complaint: Tanner Lewis is a 81 y.o. year old male who returns for follow-up of metastatic prostate cancer.     History of Present Illness:  Tanner Lewis presents for routine follow-up.  He remains on Eligard q23mo and Zytiga 500 mg/d and prednisone 5 mg/d. He is accompanied by both of his daughters today. He voices no complaints but on further questioning, does admit to persistent mid-back pain dating back to his fall in Oct 2022 and since then, he has had at least 3 additional falls. He is not eating much lately and it seems he and his daughters have been considering the option of stopping further treatment of his prostate cancer.       Oncology History:     Oncology History Overview Note   Adenocarcinoma of the prostate, stage IV (TX N1 M1a), castration resistant  Original diagnosis in 2006. Initial PSA was 6.96 ng/ml.   Prostate biopsy on 02/18/05 confirmed a prostatic adenocarcinoma, Gleason 3+3=6, associated high-grade PIN.  S/P 125-I seed implant on 04/25/2005.  Developed biochemical recurrence, treated with ADT (Casodex/Lupron) from Dec 2011 to March 2013.   PSA 2.22 ng/ml on 09/09/2014.  Bone scan on 10/08/14 was negative for e/o bone metastases.   CT abd/pel on 10/08/14 showed "stable bilateral adrenal nodules and multiple prominent retroperitoneal adenopathy with the largest measuring 1.6 x 1.1 cm in the right iliac chain. There was also a new enlarged lymph node along the right pelvic sidewall measuring 1.4 x 1 cm. The retroperitoneal lymph nodes appear to be stable in appearance. The max dimension of the largest lymph node is 1.6 x 0.8 cm.   MRI prostate on 11/13/14 demonstrated a local recurrence in the left lobe of the prostate measuring 2.6 x 1.5 x 1.6 cm with deformation of the prostate capsule and likely extracapsular spread. There was a 1.5 x 1.2 cm right iliac chain lymph node, a 1.3 x 1.3 cm common iliac  chain lymph node, additional smaller right iliac chain lymph nodes and right common iliac chain lymph nodes.   Began ADT with Lupron q6 mo again on 11/25/2014.   PSA on 11/25/14 was 33.67 ng/ml; 2.18 ng/ml on 02/05/15; 1.65 in Nov 2016; 1.34 on 05/30/15.   Patient was lost to follow-up and did not receive another dose of Lupron since June 2016.  CT chest on 07/24/15 negative for e/o metastatic disease.   Lupron was resumed on 12/21/16 and stopped again sometime in 2019.   PSA 1.62 on 06/08/17; 1.4 ng/ml on 06/20/17;   Established care with local PCP on 05/11/2019 and a PSA was found to be 3.217 ng/ml. Referred to urology for urinary frequency.   Renal US on 08/08/2019 was unremarkable.  Establish care with med oncology on 08/16/2019. PSA of 10.51 ng/ml on 08/17/2019.   CT c/a/p on 10/22/2019 showed a borderline enlarged AP window node which measures 1.4 x 1.4 cm. Indeterminate enhancing lesion in the right hepatic dome measures 1.9 x 1.7 cm. Indeterminate bilateral adrenal masses, 2.9 x 1.4 cm on right and 1.8 x 1.2 cm on left. Bilateral renal cysts noted, including a complex cyst in the upper pole right kidney which measures 4.2 x 3.0 cm, containing either internal septation or internal vessel. There is a more simple appearing cyst in the mid left kidney which measures 4.9 x 4.3 cm. Bladder wall mildly thickened. Multiple prostatic radiation  seeds noted. Unremarkable seminal vesicles. There is bulky abdominal lymphadenopathy. A left periaortic lymph node measures 2.7 x 2.2 cm. Retroaortic lymph node just below the aortic bifurcation measures 2.3 x 2.0 cm. There is no pelvic lymphadenopathy. No bone lesions.   MRI abdomen w/wo contrast on 11/13/2019 showed right liver lesion c/w hemangioma. Bilateral adrenal nodules consistent with adenomas. Renal lesions c/w cysts. Retroperitoneal adenopathy is partially imaged. For example a left periaortic lymph node measuring 3.1 x 2.6 cm.  S/P CT-guided biopsy of retroperitoneal lymph node  on 11/21/2019. Pathology consistent with a metastatic poorly-differentiated adenocarcinoma. There is comedonecrosis that can be seen in Gleason pattern 5 prostatic adenocarcinoma or colorectal adenocarcinoma. Immunostains show that the neoplastic cells are positive for NKX-3.1 and CDX-2, negative for CK7 and CK20, confirming the diagnosis of metastatic prostatic adenocarcinoma.    PSA 33.24 ng/ml on 11/29/2019.   Resume ADT with Casodex --> Eligard q48mo by 12/14/2019.   PSA down to 6.99 on 01/01/2020.   Bone scan on 01/01/2020 was negative for e/o bone metastases.   CT abdomen/pelvis on 02/21/2020. Hepatic dome mass: 2.3 x 2.1 cm (302:16), previously 1.9 x 1.7 cm. Right adrenal mass: 3.1 x 1.6 cm (302:46), previously 2.9 x 1.3 cm. Left adrenal mass: 2.0 x 1.3 cm (302:51), previously 1.8 x 1.2 cm. Left periaortic lymph node: 1.8 x 1.3 cm (302:70), previously 2.7 x 2.2 cm. Aortic bifurcation lymph node: 1.7 x 1.0 cm (302:86), previously 2.3 x 2.0 cm. Enhancing mass in hepatic dome appears slightly increased in size. Bilateral adrenal mass are seen also minimally increased in size from prior exam. Multiple complex cysts are seen in the both kidneys. Retroperitoneal lymph nodes are decreased in size.  PSA down to 3.2 on 02/29/2020.   In Oct 2021, offered to start him on a secondary oral antiandrogen, but he declined. Tanner Lewis out of town to [employer] for 3 months.   CT c/a/p on 07/11/2020. Para-aortic lymph node: 1.1 x 1.0 cm, previously 1.7 x 1.3 cm. Left common iliac node: 1.6 x 0.5 cm, previously 1.7 x 0.9 cm. There is no suspicious pulmonary nodule, mass or consolidation. A 3 mm nodule in the lingula is stable. A 1.5 x 1.4 cm mildly enlarged AP window lymph node is without significant interval change, previously 1.4 x 1.4 cm. There is no new mediastinal, axillary or hilar lymphadenopathy. A 2.4 cm benign right hepatic hemangioma is stable. No suspicious hepatic mass is noted. There are stable bilateral adrenal nodules, previously  characterized as adenomas on MRI from 11/13/2019. A 1.1 x 1.0 cm para-aortic lymph node has slightly decreased in size, previously 1.3 x 1.1 cm. There is no new abdominal/pelvic lymphadenopathy. Prostate radiation seeds are noted. No aggressive osseous lesion is identified.   Bone scan on 07/11/2020 was negative for e/o bone metastases.  PSA up to 6.05 on 07/23/2020. Eligard given on 07/23/2020 (a month later than it was due).  PSA of 7.9 on 10/08/2020; 10.9 on 11/13/2020--> now castrate-resistant  Bone scan on 11/13/2020 was negative for e/o bone metastases.   CT c/a/p on 11/13/2020. No mediastinal, hilar, or axillary lymphadenopathy is identified. Mild fusiform bronchiectasis in portions of both lungs. Scattered mild bilateral linear scarring and/or atelectasis. No hydronephrosis. 2.4 x 1.8 cm hypervascular liver lesion previously felt to represent a hemangioma measured 2.3 x 1.8 cm on the most recent study. Multiple bilateral renal cysts. Peripheral calcifications are again evident along a portion of a stable 3.8 x 3.4 cm exophytic cyst arising from the upper pole  region of the right kidney. Stable bilateral adrenal nodules. No focal lesions are identified in remaining solid abdominal visceral organs. Para-aortic lymph node measures 1.4 x 1.0 cm versus 1.3 x 1.0 cm previously. Stable diffuse urinary bladder wall thickening. Multiple brachytherapy seeds in and adjacent to the prostate gland. No suspicious osseous lesions.  On 11/14/2020, agree to start abiraterone/prednisone. PSA of 10.86 on 11/13/2020.   On 01/12/2021, PSA decreased to 1.26 ng/ml; 0.814 on 02/14/2021.   CT c/a/p with IV contrast on 03/18/2021. Left para-aortic lymph node measures 1.4 x 0.9 cm, previously 1.4 x 1.0 cm. No suspicious lung nodules. Small foci of peripheral scarring. No lymphadenopathy in the chest. No new lymphadenopathy. Radiotherapy seeds are seen in and around the prostate. The bladder wall has normal thickness. Hypervascular liver lesion  in the right hepatic lobe in segment 8 measuring 2.5 cm, previously determined to be a benign hemangioma. No suspicious liver lesions. Somewhat nodular adrenals, unchanged from prior exams. Mildly complex upper pole right renal cyst containing thin partially calcified internal septation, unchanged. Multiple benign simple renal cysts. No hydronephrosis. No renal masses. There is a T12 vertebral body fracture that is new from previous exam in June, likely non-pathologic.  PSA of 0.935 on 03/19/2021.  MRI T-spine on 03/22/2021. As on the prior CT scan, there is a fracture at T12. The fracture involves the superior endplate, especially anteriorly and centrally where there is 10-15% height loss. Marrow edema and enhancement are present, indicative of acuity. There is minimal displacement of bone into the spinal canal, approximately 2 mm narrowing. No epidural hematoma is present. There is no appreciable neoplastic mass associated with this. The paravertebral soft tissues are unremarkable. No epidural tumor is present.  PSA increased to 2.83 ng/ml on 05/19/2021.   CT c/a/p with IV contrast on 06/17/2021. Moderate chronic T12 compression fracture of the superior endplate. 2.9 x 2.5 cm hypervascular lesion in the right hepatic dome is reidentified, previously determined to be a hemangioma. Bilateral adrenal nodules unchanged compared to 10/22/2019. 3.5 x 3.4 cm cyst with thinly calcified internal septation is unchanged compatible with a minimally complicated cyst. Multiple additional bilateral simple renal cysts and additional subcentimeter renal lesions that are indeterminant but probably cysts similar to the previous exam. Numerous prostatic radiation seeds. Diffusely thick-walled bladder. No ascites or pelvic free fluid. A left paraaortic lymph node measuring 16 x 12 mm previously measured 14 x 9 mm. There are additional small retroperitoneal lymph nodes that are more prominent compared to the previous exam.   Bone scan  on 06/17/2021. Radiology report pending.         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     12/14/2019 -  Supportive Therapy    [facility] - OP Adult - Prostate - Leuprolide (ELIGARD) 45 mg Q 6 Month  Plan Provider: Ronald Lobo, MD  Treatment goal: Control  Line of treatment: [No plan line of treatment]       07/24/2021 -  Supportive Therapy    [facility] - OP Adult - On ADT for prostate cancer, osteopenia - Denosumab Q 6 Months (PROLIA)  Plan Provider: Ronald Lobo, MD  Treatment goal: Supportive  Line of treatment: First Line           Past Medical History:  Past Medical History:   Diagnosis Date    Hypertension     Prostate cancer 2010  Patient Active Problem List   Diagnosis    Essential hypertension    History of prostate cancer    Fatigue, unspecified type    Keeps losing balance    Age-related cataract of both eyes, unspecified age-related cataract type    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, bilateral    Malignant neoplasm of prostate    Nocturia    Weak urinary stream    Rising PSA following treatment for malignant neoplasm of prostate    Renal mass    Mass of both adrenal glands    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    Stage 3b chronic kidney disease    Androgen deprivation therapy    Osteopenia, unspecified location       Medications:  Current Outpatient Medications   Medication Sig Dispense Refill    abiraterone (ZYTIGA) 250 MG tablet Take 4 tablets (1,000 mg) by mouth daily 120 tablet 5    amLODIPine-benazepril (LOTREL 5-10) 5-10 MG per capsule Take 1 capsule by mouth daily 90 capsule 3    hydrALAZINE (APRESOLINE) 50 MG tablet Take 1 tablet (50 mg total) by mouth 3 (three) times daily Take 1 tablet by mouth three times daily 270 tablet 3    isosorbide dinitrate (ISORDIL) 20 MG tablet Take 1 tablet (20 mg total) by mouth 2 (two) times daily 180 tablet 3    predniSONE (DELTASONE) 5 MG tablet Take  1 tablet (5 mg total) by mouth daily 30 tablet 3    simvastatin (ZOCOR) 20 MG tablet Take 1 tablet (20 mg total) by mouth nightly Take one tablet by mouth at bedtime 90 tablet 3    tamsulosin (FLOMAX) 0.4 MG Cap Take 1 capsule (0.4 mg total) by mouth Daily after dinner Take 1 capsule by mouth daily. 90 capsule 3    trospium (SANCTURA) 20 MG tablet Take 1 tablet (20 mg) by mouth 2 (two) times daily 60 tablet 3    venlafaxine (EFFEXOR) 75 MG tablet Take 1 tablet (75 mg total) by mouth daily 90 tablet 3     No current facility-administered medications for this visit.       Allergies, Social History, and Family History were reviewed and updated.      Tobacco Dependence:  Social History     Tobacco Use   Smoking Status Former    Packs/day: 1.50    Years: 20.00    Pack years: 30.00    Types: Cigarettes    Quit date: 05/10/2014    Years since quitting: 7.1   Smokeless Tobacco Never       Smoking Cessation Counseling:  Date: June 19, 2021  N/A    Physical Exam:  Vitals:    06/19/21 1147   BP: 126/74   Pulse: (!) 57   Resp: 20   Temp: 97.1 F (36.2 C)   SpO2: 95%     Body surface area is 1.72 meters squared.  ECOG performance status: 2  Psychosocial: Appropriate affect. Supportive daughters. Ongoing concerns regarding memory loss and cognitive deficits.    General Appearance:  Alert, cooperative, in no distress, appears stated age. Noticeably more frail appearing, cachexia   Head:  Normocephalic without obvious abnormalities, atraumatic  Eyes: Intact EOM. No scleral icterus, conjunctiva/corneas clear  Nose: Deferred, wearing mask  Throat:  Deferred, wearing mask  Neck: Supple, symmetrical, trachea midline  Skin: Normal color and turgor, no rashes or lesions  Neurologic: Cranial nerves grossly intact, no focal motor deficits, normal speech and mentation.  Sitting in [employer]:     Recent Results (from the past 1008 hour(s))   CBC and differential    Collection Time: 05/19/21  2:15 PM   Result Value Ref Range    WBC  4.4 3.4 - 10.8 x10E3/uL    RBC 3.94 (L) 4.14 - 5.80 x10E6/uL    Hemoglobin 12.2 (L) 13.0 - 17.7 g/dL    Hematocrit 16.1 (L) 37.5 - 51.0 %    MCV 92 79 - 97 fL    MCH 31.0 26.6 - 33.0 pg    MCHC 33.7 31.5 - 35.7 g/dL    RDW 09.6 04.5 - 40.9 %    Platelets 226 150 - 450 x10E3/uL    Neutrophils 50 Not Estab. %    Lymphocytes Automated 35 Not Estab. %    Monocytes 12 Not Estab. %    Eosinophils Automated 2 Not Estab. %    Basophils Automated 1 Not Estab. %    Neutrophils Absolute Count 2.2 1.4 - 7.0 x10E3/uL    Lymphocytes Absolute 1.5 0.7 - 3.1 x10E3/uL    Monocytes Absolute 0.5 0.1 - 0.9 x10E3/uL    Eosinophils Absolute 0.1 0.0 - 0.4 x10E3/uL    Baso(Absolute) 0.0 0.0 - 0.2 x10E3/uL    Immature Granulocytes 0 Not Estab. %    Immature Granulocytes Absolute 0.0 0.0 - 0.1 x10E3/uL   Comprehensive metabolic panel    Collection Time: 05/19/21  2:15 PM   Result Value Ref Range    Glucose 102 (H) 70 - 99 mg/dL    BUN 19 8 - 27 mg/dL    Creatinine 8.11 (H) 0.76 - 1.27 mg/dL    eGFR 37 (L) >91 YN/WGN/5.62    BUN / Creatinine Ratio 10 10 - 24    Sodium 141 134 - 144 mmol/L    Potassium 3.8 3.5 - 5.2 mmol/L    Chloride 101 96 - 106 mmol/L    CO2 25 20 - 29 mmol/L    Calcium 9.7 8.6 - 10.2 mg/dL    Protein, Total 7.2 6.0 - 8.5 g/dL    Albumin 4.1 3.7 - 4.7 g/dL    Globulin, Total 3.1 1.5 - 4.5 g/dL    Albumin/Globulin Ratio 1.3 1.2 - 2.2    Bilirubin, Total 0.8 0.0 - 1.2 mg/dL    Alkaline Phosphatase 54 44 - 121 IU/L    AST (SGOT) 25 0 - 40 IU/L    ALT 19 0 - 44 IU/L   PSA    Collection Time: 05/19/21  2:15 PM   Result Value Ref Range    Prostate Specific Antigen, Total 2.828 ng/mL   Testosterone    Collection Time: 05/19/21  2:15 PM   Result Value Ref Range    Testosterone <3 (L) 264 - 916 ng/dL   Whole Blood Creatinine with GFR POCT    Collection Time: 06/17/21 11:07 AM   Result Value Ref Range    Whole Blood Creatinine POCT 1.4 (H) 0.5 - 1.1 mg/dL    GFR POCT >13 >08 MV/HQI/6.96 m2   .    Rads:     MRI T-spine on 03/22/2021.  As on the prior CT scan, there is a fracture at T12. The fracture involves the superior endplate, especially anteriorly and centrally where there is 10-15% height loss. Marrow edema and enhancement are present, indicative of acuity. There is minimal displacement of bone into the spinal canal, approximately 2 mm narrowing. No epidural hematoma is present. There is no  appreciable neoplastic mass associated with this. The paravertebral soft tissues are unremarkable. No epidural tumor is present.    CT c/a/p with IV contrast on 06/17/2021. Moderate chronic T12 compression fracture of the superior endplate. 2.9 x 2.5 cm hypervascular lesion in the right hepatic dome is reidentified, previously determined to be a hemangioma. Bilateral adrenal nodules unchanged compared to 10/22/2019. 3.5 x 3.4 cm cyst with thinly calcified internal septation is unchanged compatible with a minimally complicated cyst. Multiple additional bilateral simple renal cysts and additional subcentimeter renal lesions that are indeterminant but probably cysts similar to the previous exam. Numerous prostatic radiation seeds. Diffusely thick-walled bladder. No ascites or pelvic free fluid. A left paraaortic lymph node measuring 16 x 12 mm previously measured 14 x 9 mm. There are additional small retroperitoneal lymph nodes that are more prominent compared to the previous exam.     Bone scan on 06/17/2021. Radiology report pending.        Assessment:     1. 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 Androgen deprivation therapy    3. Osteopenia, unspecified location    4. Compression fracture of T12 vertebra, sequela        Recommendations:     Adenocarcinoma of the prostate, stage IV (TX N1 M1a), castrate-resistant. Oncologic history dating back to initial diagnosis in 2006 is summarized above. Prior to establishing care with me, he was receiving ADT intermittently for biochemical recurrence s/p brachytherapy, then for nodal  metastases. By the time I met him in March 2021 for a rising PSA level, he had been off ADT for at least 2 years and his PSA had risen up to 10.5 by 08/17/2019. CT c/a/p and MRI abdomen done in May/June 2021 were notable for retroperitoneal adenopathy. CT-guided retroperitoneal LN biopsy on 11/21/2019 confirmed diagnosis of metastatic prostate cancer. Bone scan on 01/01/2020 was negative for e/o bone metastases.  ADT was resumed in July 2021 and he has remained on q43mo Eligard continuously since then. His PSA decreased from 33.2 in July 2021, down to 3.2 on 02/29/2020, consistent with response to ADT but began to rise up to 10.9 by 11/13/2020, consistent with development of castrate resistance. His bone scan on 11/13/2020 was negative for e/o bone metastases and his CT c/a/p on 6/16 showed stable mild para-aortic lymphadenopathy (measures 1.4 x 1 cm, up slightly from 1.3 x 1 cm). In June 2022, we agreed to start him on Zytiga 1000 mg/d plus prednisone 5 mg/d for his metastatic CRPC but due to side effects, the dose was reduced to 500 mg/d in August 2022. He remains on Eligard and Zytiga 500 mg/d with prednisone at this time. Over the past several months, his performance status has gradually deteriorated and he has become increasingly frail. He has fallen at home multiple times and is not eating as he used to. Since his PSA nadired to 0.8 in Sept 2022 on current treatment regimen, it has risen to 2.83 as of last month (tripled in 2 months' time). His recent CT c/a/p on 06/17/2021 showed mild increase in retroperitoneal adenopathy. Bone scan report is pending, though he has not had bone metastases. I explained to Tanner Lewis and his daughters that his cancer has become resistant to the [place], but based on relatively stable scans, we can continue current regimen for now. We did agree to increase the dose of Zytiga to 750 mg/d assuming he can tolerate it. They also brought up the option of stopping further prostate cancer  treatment in the near future because of his poor overall condition and I did agree with them that given his declining performance status, I'm very limited in terms of other treatments I could offer to him. I did entertain the option of switching to [facility] (he is not a candidate for chemotherapy and Standley Dakins is not indicated in the absence of bone predominant disease), but this may further exacerbate his cognitive issues and increase his fall risk and will ultimately not be very effective post-progression on Zytiga. We ultimately agreed to consider home hospice over the next few weeks before he returns on 07/24/2021 for follow-up and his next dose of Eligard.     Androgen deprivation therapy/Osteopenia. DEXA scan in May 2021 c/w osteopenia of the left femoral neck. Normal BMD in the spine. Prolia q57mo ordered to start on 07/24/2021. He has dentures and denies any gum/jaw pain. We agreed to defer dental clearance given low risk of ONJ.     T12 fracture. S/P fall ~early Oct 2022. MRI T-spine on 03/22/2021 confirmed a fracture at T12. The fracture involves the superior endplate, especially anteriorly and centrally where there is 10-15% height loss. Marrow edema and enhancement are present, indicative of acuity. There is minimal displacement of bone into the spinal canal, approximately 2 mm narrowing. No epidural hematoma is present. There is no appreciable neoplastic mass associated with this. Mid-back pain remains relatively mild. He was referred to a spine specialist, though we're holding off on making an appt for now given the possibility of transitioning to hospice soon.      Follow-up. We agreed for Tanner Lewis to return on 07/24/2022 for labs, office visit, Eligard, and Prolia. I encouraged the patient and his daughter to call or message me via MyChart with any questions or concerns prior to their next appointment here. They had many relevant questions which I answered to the best of my ability and to their apparent  satisfaction. They voiced understanding and agreed to the plan outlined above.       LOS Documentation:    Number and Complexity of Problems Addressed:  1 or more chronic illness with exacerbation, progression or side effects of treatment    Amount and/or Complexity of Data Reviewed:  - Reviewed the following results from 2023 and 2022: 3+ unique labs and 1 unique imaging test  - Ordered the following unique test(s): 3+ unique labs  - Performed an assessment requiring an independent historian - daughter    Risk of Complications and/or Morbidity or Mortality of Patient Management:  Drug therapy requiring intensive monitoring for toxicity        I spent a total of 45 minutes reviewing the patient's chart, including relevant radiological and laboratory findings, pathology reports, and available outside records, counseling, medical decision making, coordination of care, and documentation for today's encounter.    Disclaimer: This note was dictated with voice recognition software. Similar sounding words can inadvertently be transcribed and may not be corrected upon review.    Signed by:    Serena Croissant, MD  Medical Oncologist  [facility]  [address] [facility] 5th [address]  [place], [place] [address]  Tel: [phone]  Fax: [phone]     Ronald Lobo, MD  1/20/202312:25 PM

## 2021-07-08 ENCOUNTER — Encounter (INDEPENDENT_AMBULATORY_CARE_PROVIDER_SITE_OTHER): Payer: Self-pay | Admitting: Family Medicine

## 2021-07-20 ENCOUNTER — Other Ambulatory Visit: Payer: Self-pay | Admitting: Hematology & Oncology

## 2021-07-20 ENCOUNTER — Encounter: Payer: Self-pay | Admitting: Hematology & Oncology

## 2021-07-22 ENCOUNTER — Encounter (INDEPENDENT_AMBULATORY_CARE_PROVIDER_SITE_OTHER): Payer: Self-pay | Admitting: Family Medicine

## 2021-07-23 ENCOUNTER — Telehealth (INDEPENDENT_AMBULATORY_CARE_PROVIDER_SITE_OTHER): Payer: Self-pay | Admitting: Family Medicine

## 2021-07-23 NOTE — Telephone Encounter (Signed)
Pt called, talked to daughter, per her statement Pt was just tired, but this will get re-scheduled. Also ,Pt will see oncologist tomorrow, just an FYI .

## 2021-07-23 NOTE — Telephone Encounter (Signed)
Home health nurse called to alert PCP that pt missed his home health visit today because he wasn't feeling well.    [facility]

## 2021-07-24 ENCOUNTER — Ambulatory Visit: Payer: Medicare Other | Attending: Hematology & Oncology | Admitting: Hematology & Oncology

## 2021-07-24 ENCOUNTER — Other Ambulatory Visit (FREE_STANDING_LABORATORY_FACILITY): Payer: Medicare Other

## 2021-07-24 ENCOUNTER — Telehealth: Payer: Self-pay

## 2021-07-24 ENCOUNTER — Encounter: Payer: Self-pay | Admitting: Hematology & Oncology

## 2021-07-24 ENCOUNTER — Ambulatory Visit: Payer: Medicare Other

## 2021-07-24 ENCOUNTER — Other Ambulatory Visit (INDEPENDENT_AMBULATORY_CARE_PROVIDER_SITE_OTHER): Payer: Self-pay

## 2021-07-24 VITALS — BP 158/83 | HR 81 | Temp 98.9°F | Resp 16 | Ht 67.0 in | Wt 134.0 lb

## 2021-07-24 DIAGNOSIS — R160 Hepatomegaly, not elsewhere classified: Secondary | ICD-10-CM

## 2021-07-24 DIAGNOSIS — R627 Adult failure to thrive: Secondary | ICD-10-CM | POA: Insufficient documentation

## 2021-07-24 DIAGNOSIS — C61 Malignant neoplasm of prostate: Secondary | ICD-10-CM

## 2021-07-24 DIAGNOSIS — Z192 Hormone resistant malignancy status: Secondary | ICD-10-CM | POA: Insufficient documentation

## 2021-07-24 DIAGNOSIS — Z79818 Long term (current) use of other agents affecting estrogen receptors and estrogen levels: Secondary | ICD-10-CM

## 2021-07-24 DIAGNOSIS — Z191 Hormone sensitive malignancy status: Secondary | ICD-10-CM

## 2021-07-24 DIAGNOSIS — E278 Other specified disorders of adrenal gland: Secondary | ICD-10-CM

## 2021-07-24 DIAGNOSIS — M8580 Other specified disorders of bone density and structure, unspecified site: Secondary | ICD-10-CM

## 2021-07-24 DIAGNOSIS — C772 Secondary and unspecified malignant neoplasm of intra-abdominal lymph nodes: Secondary | ICD-10-CM

## 2021-07-24 LAB — CBC AND DIFFERENTIAL
Absolute NRBC: 0 10*3/uL (ref 0.00–0.00)
Basophils Absolute Automated: 0.02 10*3/uL (ref 0.00–0.08)
Basophils Automated: 0.5 %
Eosinophils Absolute Automated: 0.06 10*3/uL (ref 0.00–0.44)
Eosinophils Automated: 1.5 %
Hematocrit: 35.4 % — ABNORMAL LOW (ref 37.6–49.6)
Hgb: 11.9 g/dL — ABNORMAL LOW (ref 12.5–17.1)
Immature Granulocytes Absolute: 0.01 10*3/uL (ref 0.00–0.07)
Immature Granulocytes: 0.3 %
Instrument Absolute Neutrophil Count: 1.56 10*3/uL (ref 1.10–6.33)
Lymphocytes Absolute Automated: 1.71 10*3/uL (ref 0.42–3.22)
Lymphocytes Automated: 43.7 %
MCH: 30.7 pg (ref 25.1–33.5)
MCHC: 33.6 g/dL (ref 31.5–35.8)
MCV: 91.5 fL (ref 78.0–96.0)
MPV: 8.4 fL — ABNORMAL LOW (ref 8.9–12.5)
Monocytes Absolute Automated: 0.55 10*3/uL (ref 0.21–0.85)
Monocytes: 14.1 %
Neutrophils Absolute: 1.56 10*3/uL (ref 1.10–6.33)
Neutrophils: 39.9 %
Nucleated RBC: 0 /100 WBC (ref 0.0–0.0)
Platelets: 217 10*3/uL (ref 142–346)
RBC: 3.87 10*6/uL — ABNORMAL LOW (ref 4.20–5.90)
RDW: 14 % (ref 11–15)
WBC: 3.91 10*3/uL (ref 3.10–9.50)

## 2021-07-24 LAB — COMPREHENSIVE METABOLIC PANEL
ALT: 43 U/L (ref 0–55)
AST (SGOT): 40 U/L (ref 5–41)
Albumin/Globulin Ratio: 0.9 (ref 0.9–2.2)
Albumin: 3.5 g/dL (ref 3.5–5.0)
Alkaline Phosphatase: 45 U/L (ref 37–117)
Anion Gap: 5 (ref 5.0–15.0)
BUN: 26 mg/dL (ref 9.0–28.0)
Bilirubin, Total: 0.5 mg/dL (ref 0.2–1.2)
CO2: 27 mEq/L (ref 17–29)
Calcium: 9.8 mg/dL (ref 7.9–10.2)
Chloride: 105 mEq/L (ref 99–111)
Creatinine: 1.5 mg/dL (ref 0.5–1.5)
Globulin: 4 g/dL — ABNORMAL HIGH (ref 2.0–3.6)
Glucose: 103 mg/dL — ABNORMAL HIGH (ref 70–100)
Potassium: 4.8 mEq/L (ref 3.5–5.3)
Protein, Total: 7.5 g/dL (ref 6.0–8.3)
Sodium: 137 mEq/L (ref 135–145)

## 2021-07-24 LAB — TESTOSTERONE: Testosterone: 4 ng/dL — ABNORMAL LOW (ref 241–827)

## 2021-07-24 LAB — GFR: EGFR: 54.4

## 2021-07-24 LAB — HEMOLYSIS INDEX: Hemolysis Index: 5 Index (ref 0–24)

## 2021-07-24 LAB — PSA: Prostate Specific Antigen, Total: 6.738 ng/mL — ABNORMAL HIGH (ref 0.000–4.000)

## 2021-07-24 NOTE — Telephone Encounter (Signed)
[facility] Social Worker Case Mgr.   07/24/2021    Referral Reason: Hospice    Social worker received new referral from Arlyce Harman, RN for hospice. Social worker placed call to patient but main number on file is to his daughter. Patient is aware of moving forward with hospice and would like referral to be sent to [facility]. Social worker faced referral on patient's behalf.     Chauncy Lean, MSW  Social Work Case Production designer, theatre/television/film I  [employer]  [phone]

## 2021-07-24 NOTE — Telephone Encounter (Signed)
RN placed order for hospice and talked to social worker Morrie Sheldon to arrange future care.

## 2021-07-24 NOTE — Progress Notes (Signed)
[facility] ON[facility]Y FOLLOW-UP NOTE      Date of service: July 24, 2021    Subjective:   Chief complaint: Tanner Lewis is a 81 y.o. year old male who returns for follow-up of metastatic prostate cancer.     History of Present Illness:  Tanner Lewis presents for routine follow-up.  He is scheduled for Eligard and Prolia today. In the interim, he stopped taking the Zytiga. He remains very weak and needs full assistance when walking. He gets PT twice per week but they're not sure if it's helping much at all.       Oncology History:     Oncology History Overview Note   Adenocarcinoma of the prostate, stage IV (TX N1 M1a), castration resistant  Original diagnosis in 2006. Initial PSA was 6.96 ng/ml.   Prostate biopsy on 02/18/05 confirmed a prostatic adenocarcinoma, Gleason 3+3=6, associated high-grade PIN.  S/P 125-I seed implant on 04/25/2005.  Developed biochemical recurrence, treated with ADT (Casodex/Lupron) from Dec 2011 to March 2013.   PSA 2.22 ng/ml on 09/09/2014.  Bone scan on 10/08/14 was negative for e/o bone metastases.   CT abd/pel on 10/08/14 showed "stable bilateral adrenal nodules and multiple prominent retroperitoneal adenopathy with the largest measuring 1.6 x 1.1 cm in the right iliac chain. There was also a new enlarged lymph node along the right pelvic sidewall measuring 1.4 x 1 cm. The retroperitoneal lymph nodes appear to be stable in appearance. The max dimension of the largest lymph node is 1.6 x 0.8 cm.   MRI prostate on 11/13/14 demonstrated a local recurrence in the left lobe of the prostate measuring 2.6 x 1.5 x 1.6 cm with deformation of the prostate capsule and likely extracapsular spread. There was a 1.5 x 1.2 cm right iliac chain lymph node, a 1.3 x 1.3 cm common iliac chain lymph node, additional smaller right iliac chain lymph nodes and right common iliac chain lymph nodes.   Began ADT with Lupron q6 mo again on 11/25/2014.   PSA on 11/25/14 was 33.67 ng/ml; 2.18 ng/ml on 02/05/15; 1.65 in  Nov 2016; 1.34 on 05/30/15.   Patient was lost to follow-up and did not receive another dose of Lupron since June 2016.  CT chest on 07/24/15 negative for e/o metastatic disease.   Lupron was resumed on 12/21/16 and stopped again sometime in 2019.   PSA 1.62 on 06/08/17; 1.4 ng/ml on 06/20/17;   Established care with local PCP on 05/11/2019 and a PSA was found to be 3.217 ng/ml. Referred to urology for urinary frequency.   Renal US on 08/08/2019 was unremarkable.  Establish care with med oncology on 08/16/2019. PSA of 10.51 ng/ml on 08/17/2019.   CT c/a/p on 10/22/2019 showed a borderline enlarged AP window node which measures 1.4 x 1.4 cm. Indeterminate enhancing lesion in the right hepatic dome measures 1.9 x 1.7 cm. Indeterminate bilateral adrenal masses, 2.9 x 1.4 cm on right and 1.8 x 1.2 cm on left. Bilateral renal cysts noted, including a complex cyst in the upper pole right kidney which measures 4.2 x 3.0 cm, containing either internal septation or internal vessel. There is a more simple appearing cyst in the mid left kidney which measures 4.9 x 4.3 cm. Bladder wall mildly thickened. Multiple prostatic radiation seeds noted. Unremarkable seminal vesicles. There is bulky abdominal lymphadenopathy. A left periaortic lymph node measures 2.7 x 2.2 cm. Retroaortic lymph node just below the aortic bifurcation measures 2.3 x 2.0 cm. There is no pelvic lymphadenopathy. No  bone lesions.   MRI abdomen w/wo contrast on 11/13/2019 showed right liver lesion c/w hemangioma. Bilateral adrenal nodules consistent with adenomas. Renal lesions c/w cysts. Retroperitoneal adenopathy is partially imaged. For example a left periaortic lymph node measuring 3.1 x 2.6 cm.  S/P CT-guided biopsy of retroperitoneal lymph node on 11/21/2019. Pathology consistent with a metastatic poorly-differentiated adenocarcinoma. There is comedonecrosis that can be seen in Gleason pattern 5 prostatic adenocarcinoma or colorectal adenocarcinoma. Immunostains  show that the neoplastic cells are positive for NKX-3.1 and CDX-2, negative for CK7 and CK20, confirming the diagnosis of metastatic prostatic adenocarcinoma.    PSA 33.24 ng/ml on 11/29/2019.   Resume ADT with Casodex --> Eligard q11mo by 12/14/2019.   PSA down to 6.99 on 01/01/2020.   Bone scan on 01/01/2020 was negative for e/o bone metastases.   CT abdomen/pelvis on 02/21/2020. Hepatic dome mass: 2.3 x 2.1 cm (302:16), previously 1.9 x 1.7 cm. Right adrenal mass: 3.1 x 1.6 cm (302:46), previously 2.9 x 1.3 cm. Left adrenal mass: 2.0 x 1.3 cm (302:51), previously 1.8 x 1.2 cm. Left periaortic lymph node: 1.8 x 1.3 cm (302:70), previously 2.7 x 2.2 cm. Aortic bifurcation lymph node: 1.7 x 1.0 cm (302:86), previously 2.3 x 2.0 cm. Enhancing mass in hepatic dome appears slightly increased in size. Bilateral adrenal mass are seen also minimally increased in size from prior exam. Multiple complex cysts are seen in the both kidneys. Retroperitoneal lymph nodes are decreased in size.  PSA down to 3.2 on 02/29/2020.   In Oct 2021, offered to start him on a secondary oral antiandrogen, but he declined. Tanner Lewis out of town to [employer] for 3 months.   CT c/a/p on 07/11/2020. Para-aortic lymph node: 1.1 x 1.0 cm, previously 1.7 x 1.3 cm. Left common iliac node: 1.6 x 0.5 cm, previously 1.7 x 0.9 cm. There is no suspicious pulmonary nodule, mass or consolidation. A 3 mm nodule in the lingula is stable. A 1.5 x 1.4 cm mildly enlarged AP window lymph node is without significant interval change, previously 1.4 x 1.4 cm. There is no new mediastinal, axillary or hilar lymphadenopathy. A 2.4 cm benign right hepatic hemangioma is stable. No suspicious hepatic mass is noted. There are stable bilateral adrenal nodules, previously characterized as adenomas on MRI from 11/13/2019. A 1.1 x 1.0 cm para-aortic lymph node has slightly decreased in size, previously 1.3 x 1.1 cm. There is no new abdominal/pelvic lymphadenopathy. Prostate radiation seeds are  noted. No aggressive osseous lesion is identified.   Bone scan on 07/11/2020 was negative for e/o bone metastases.  PSA up to 6.05 on 07/23/2020. Eligard given on 07/23/2020 (a month later than it was due).  PSA of 7.9 on 10/08/2020; 10.9 on 11/13/2020--> now castrate-resistant  Bone scan on 11/13/2020 was negative for e/o bone metastases.   CT c/a/p on 11/13/2020. No mediastinal, hilar, or axillary lymphadenopathy is identified. Mild fusiform bronchiectasis in portions of both lungs. Scattered mild bilateral linear scarring and/or atelectasis. No hydronephrosis. 2.4 x 1.8 cm hypervascular liver lesion previously felt to represent a hemangioma measured 2.3 x 1.8 cm on the most recent study. Multiple bilateral renal cysts. Peripheral calcifications are again evident along a portion of a stable 3.8 x 3.4 cm exophytic cyst arising from the upper pole region of the right kidney. Stable bilateral adrenal nodules. No focal lesions are identified in remaining solid abdominal visceral organs. Para-aortic lymph node measures 1.4 x 1.0 cm versus 1.3 x 1.0 cm previously. Stable diffuse urinary bladder wall  thickening. Multiple brachytherapy seeds in and adjacent to the prostate gland. No suspicious osseous lesions.  On 11/14/2020, agree to start abiraterone/prednisone. PSA of 10.86 on 11/13/2020.   On 01/12/2021, PSA decreased to 1.26 ng/ml; 0.814 on 02/14/2021.   CT c/a/p with IV contrast on 03/18/2021. Left para-aortic lymph node measures 1.4 x 0.9 cm, previously 1.4 x 1.0 cm. No suspicious lung nodules. Small foci of peripheral scarring. No lymphadenopathy in the chest. No new lymphadenopathy. Radiotherapy seeds are seen in and around the prostate. The bladder wall has normal thickness. Hypervascular liver lesion in the right hepatic lobe in segment 8 measuring 2.5 cm, previously determined to be a benign hemangioma. No suspicious liver lesions. Somewhat nodular adrenals, unchanged from prior exams. Mildly complex upper pole right  renal cyst containing thin partially calcified internal septation, unchanged. Multiple benign simple renal cysts. No hydronephrosis. No renal masses. There is a T12 vertebral body fracture that is new from previous exam in June, likely non-pathologic.  PSA of 0.935 on 03/19/2021.  MRI T-spine on 03/22/2021. As on the prior CT scan, there is a fracture at T12. The fracture involves the superior endplate, especially anteriorly and centrally where there is 10-15% height loss. Marrow edema and enhancement are present, indicative of acuity. There is minimal displacement of bone into the spinal canal, approximately 2 mm narrowing. No epidural hematoma is present. There is no appreciable neoplastic mass associated with this. The paravertebral soft tissues are unremarkable. No epidural tumor is present.  PSA increased to 2.83 ng/ml on 05/19/2021.   CT c/a/p with IV contrast on 06/17/2021. Moderate chronic T12 compression fracture of the superior endplate. 2.9 x 2.5 cm hypervascular lesion in the right hepatic dome is reidentified, previously determined to be a hemangioma. Bilateral adrenal nodules unchanged compared to 10/22/2019. 3.5 x 3.4 cm cyst with thinly calcified internal septation is unchanged compatible with a minimally complicated cyst. Multiple additional bilateral simple renal cysts and additional subcentimeter renal lesions that are indeterminant but probably cysts similar to the previous exam. Numerous prostatic radiation seeds. Diffusely thick-walled bladder. No ascites or pelvic free fluid. A left paraaortic lymph node measuring 16 x 12 mm previously measured 14 x 9 mm. There are additional small retroperitoneal lymph nodes that are more prominent compared to the previous exam.   Bone scan on 06/17/2021. Radiology report pending.         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     12/14/2019 - 01/21/2021  Supportive Therapy    [facility] - OP Adult - Prostate - Leuprolide (ELIGARD) 45 mg Q 6 Month  Plan Provider: Ronald LoboEun-mi Janaria Mccammon, MD  Treatment goal: Control  Line of treatment: [No plan line of treatment]       07/24/2021 - 07/24/2021 Supportive Therapy    [facility] - OP Adult - On ADT for prostate cancer, osteopenia - Denosumab Q 6 Months (PROLIA)  Plan Provider: Ronald LoboEun-mi Arlethia Basso, MD  Treatment goal: Supportive  Line of treatment: First Line           Past Medical History:  Past Medical History:   Diagnosis Date    Hypertension     Prostate cancer 2010       Patient Active Problem List   Diagnosis    Essential hypertension    History of prostate cancer    Fatigue, unspecified type    Keeps losing balance    Age-related cataract of both  eyes, unspecified age-related cataract type    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, bilateral    Malignant neoplasm of prostate    Nocturia    Weak urinary stream    Rising PSA following treatment for malignant neoplasm of prostate    Renal mass    Mass of both adrenal glands    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    Stage 3b chronic kidney disease    Androgen deprivation therapy    Osteopenia, unspecified location       Medications:  Current Outpatient Medications   Medication Sig Dispense Refill    amLODIPine-benazepril (LOTREL 5-10) 5-10 MG per capsule Take 1 capsule by mouth daily 90 capsule 3    hydrALAZINE (APRESOLINE) 50 MG tablet Take 1 tablet (50 mg total) by mouth 3 (three) times daily Take 1 tablet by mouth three times daily 270 tablet 3    isosorbide dinitrate (ISORDIL) 20 MG tablet Take 1 tablet (20 mg total) by mouth 2 (two) times daily 180 tablet 3    predniSONE (DELTASONE) 5 MG tablet Take 1 tablet (5 mg total) by mouth daily 30 tablet 3    simvastatin (ZOCOR) 20 MG tablet Take 1 tablet (20 mg total) by mouth nightly Take one tablet by mouth at bedtime 90 tablet 3    tamsulosin (FLOMAX) 0.4 MG Cap Take 1 capsule (0.4 mg total) by mouth Daily after dinner Take 1 capsule by  mouth daily. 90 capsule 3    trospium (SANCTURA) 20 MG tablet Take 1 tablet (20 mg) by mouth 2 (two) times daily 60 tablet 3    venlafaxine (EFFEXOR) 75 MG tablet Take 1 tablet (75 mg total) by mouth daily 90 tablet 3    abiraterone (ZYTIGA) 250 MG tablet Take 4 tablets (1,000 mg) by mouth daily (Patient not taking: Reported on 07/24/2021) 120 tablet 5     No current facility-administered medications for this visit.       Allergies, Social History, and Family History were reviewed and updated.      Tobacco Dependence:  Social History     Tobacco Use   Smoking Status Former    Packs/day: 1.50    Years: 20.00    Pack years: 30.00    Types: Cigarettes    Quit date: 05/10/2014    Years since quitting: 7.2   Smokeless Tobacco Never       Smoking Cessation Counseling:  Date: July 24, 2021  N/A    Physical Exam:  Vitals:    07/24/21 1010   BP: 158/83   Pulse: 81   Resp: 16   Temp: 98.9 F (37.2 C)   SpO2: 98%     Body surface area is 1.7 meters squared.  ECOG performance status: 2  Psychosocial: Appropriate affect. Supportive daughters. Ongoing concerns regarding memory loss and cognitive deficits.    General Appearance:  Alert, cooperative, in no distress, appears stated age. Noticeably more frail appearing, cachexia   Head:  Normocephalic without obvious abnormalities, atraumatic  Eyes: Intact EOM. No scleral icterus, conjunctiva/corneas clear  Nose: Deferred, wearing mask  Throat:  Deferred, wearing mask  Neck: Supple, symmetrical, trachea midline  Skin: Normal color and turgor, no rashes or lesions  Neurologic: Cranial nerves grossly intact, no focal motor deficits, normal speech and mentation. Sitting in wheechair    Labs:     Recent Results (from the past 1008 hour(s))   Whole Blood Creatinine with GFR POCT    Collection Time: 06/17/21 11:07  AM   Result Value Ref Range    Whole Blood Creatinine POCT 1.4 (H) 0.5 - 1.1 mg/dL    GFR POCT >13 >08 MV/HQI/6.96 m2   CBC and differential    Collection Time: 07/24/21   9:55 AM   Result Value Ref Range    WBC 3.91 3.10 - 9.50 x10 3/uL    Hgb 11.9 (L) 12.5 - 17.1 g/dL    Hematocrit 29.5 (L) 37.6 - 49.6 %    Platelets 217 142 - 346 x10 3/uL    RBC 3.87 (L) 4.20 - 5.90 x10 6/uL    MCV 91.5 78.0 - 96.0 fL    MCH 30.7 25.1 - 33.5 pg    MCHC 33.6 31.5 - 35.8 g/dL    RDW 14 11 - 15 %    MPV 8.4 (L) 8.9 - 12.5 fL    Instrument Absolute Neutrophil Count 1.56 1.10 - 6.33 x10 3/uL    Neutrophils 39.9 None %    Lymphocytes Automated 43.7 None %    Monocytes 14.1 None %    Eosinophils Automated 1.5 None %    Basophils Automated 0.5 None %    Immature Granulocytes 0.3 None %    Nucleated RBC 0.0 0.0 - 0.0 /100 WBC    Neutrophils Absolute 1.56 1.10 - 6.33 x10 3/uL    Lymphocytes Absolute Automated 1.71 0.42 - 3.22 x10 3/uL    Monocytes Absolute Automated 0.55 0.21 - 0.85 x10 3/uL    Eosinophils Absolute Automated 0.06 0.00 - 0.44 x10 3/uL    Basophils Absolute Automated 0.02 0.00 - 0.08 x10 3/uL    Immature Granulocytes Absolute 0.01 0.00 - 0.07 x10 3/uL    Absolute NRBC 0.00 0.00 - 0.00 x10 3/uL   Comprehensive metabolic panel    Collection Time: 07/24/21  9:55 AM   Result Value Ref Range    Glucose 103 (H) 70 - 100 mg/dL    BUN 28.4 9.0 - 13.2 mg/dL    Creatinine 1.5 0.5 - 1.5 mg/dL    Sodium 440 102 - 725 mEq/L    Potassium 4.8 3.5 - 5.3 mEq/L    Chloride 105 99 - 111 mEq/L    CO2 27 17 - 29 mEq/L    Calcium 9.8 7.9 - 10.2 mg/dL    Protein, Total 7.5 6.0 - 8.3 g/dL    Albumin 3.5 3.5 - 5.0 g/dL    AST (SGOT) 40 5 - 41 U/L    ALT 43 0 - 55 U/L    Alkaline Phosphatase 45 37 - 117 U/L    Bilirubin, Total 0.5 0.2 - 1.2 mg/dL    Globulin 4.0 (H) 2.0 - 3.6 g/dL    Albumin/Globulin Ratio 0.9 0.9 - 2.2    Anion Gap 5.0 5.0 - 15.0   GFR    Collection Time: 07/24/21  9:55 AM   Result Value Ref Range    EGFR 54.4    Hemolysis index    Collection Time: 07/24/21  9:55 AM   Result Value Ref Range    Hemolysis Index 5 0 - 24 Index   .    Rads:       Bone scan on 06/17/2021.  Diffuse uptake in the vertebral  body of T12 which is associated with a compression fracture on CT. No other new areas of uptake to indicate metastatic disease.        Assessment:     1. Metastatic castration-resistant adenocarcinoma of prostate    2. Failure  to thrive in adult      Recommendations:     Adenocarcinoma of the prostate, stage IV (TX N1 M1a), castrate-resistant. Oncologic history dating back to initial diagnosis in 2006 is summarized above. He has been on ADT and was started on Zytiga 1000 mg/d plus prednisone 5 mg/d for his metastatic CRPC in June 2022 but due to side effects, the dose was reduced to 500 mg/d in August 2022. Over the past several months, his performance status has gradually deteriorated and he has become increasingly frail. He continues to have frequent falls with no significant progress with physical therapy. As of Dec 2022, his PSA had increased from 0.9 in Oct 2022 to 2.83, consistent with progression of disease by PSA.  His recent CT c/a/p on 06/17/2021 showed mild increase in retroperitoneal adenopathy. At his last appt, he and his daughter brought up the option of stopping further prostate cancer treatment because of his poor overall condition and I did agree with them that given his declining performance status, I'm very limited in terms of other treatments I could offer to him. I did entertain the option of switching to [facility] (he is not a candidate for chemotherapy and Standley Dakins is not indicated in the absence of bone predominant disease), but this may further exacerbate his cognitive issues and increase his fall risk and will ultimately not be very effective post-progression on Zytiga. Since his last appt, he decided to stop the Zytiga and I advised that Eligard alone will not be effective at controlling his prostate cancer, so I would advise stopping this as well as we consider home hospice. They are agreeable to this. Today, he and his daughter agreed to proceed with a hospice referral and all future  treatment appts here will be canceled.     Follow-up. We agreed for Mr. Wojcicki to return on an as needed basis as he transitions to home hospice. I encouraged the patient and his daughter to call or message me via MyChart with any questions or concerns. They had many relevant questions which I answered to the best of my ability and to their apparent satisfaction. They voiced understanding and agreed to the plan outlined above.       LOS Documentation:    Number and Complexity of Problems Addressed:  1 or more chronic illnesses with severe exacerbation, progression, or side effects of treatment    Amount and/or Complexity of Data Reviewed:  - Reviewed the following results from 2023: 3+ unique labs and 1 unique imaging test  - Performed an assessment requiring an independent historian - daughter    Risk of Complications and/or Morbidity or Mortality of Patient Management:  Decision not to resuscitate or to de-escalate care because of poor prognosis        I spent a total of 30 minutes reviewing the patient's chart, including relevant radiological and laboratory findings, pathology reports, and available outside records, counseling, medical decision making, coordination of care, and documentation for today's encounter.    Disclaimer: This note was dictated with voice recognition software. Similar sounding words can inadvertently be transcribed and may not be corrected upon review.    Signed by:    Serena Croissant, MD  Medical Oncologist  [facility]  [address], [facility] 5th Floor  [place], [place] [address]  Tel: [phone]  Fax: [phone]     Ronald Lobo, MD  2/24/202310:32 AM

## 2021-07-27 ENCOUNTER — Encounter (INDEPENDENT_AMBULATORY_CARE_PROVIDER_SITE_OTHER): Payer: Self-pay | Admitting: Family Medicine

## 2021-07-27 NOTE — Progress Notes (Signed)
S/w PCP. PCP agreed to oversee the pt while in hospice. Provided verbal to [facility] nurse from [facility]. No further actions at this time.

## 2021-07-28 ENCOUNTER — Telehealth: Payer: Self-pay

## 2021-07-28 NOTE — Telephone Encounter (Signed)
[facility] Social Work Case Manager  07/28/2021    Social worker received incoming e-mail from [place] with [facility] informing that patient was admitted to hospice on 07/27/2021.    Chauncy Lean, MSW  Social Work Case Production designer, theatre/television/film I  [employer]  [phone]

## 2021-08-04 ENCOUNTER — Encounter (INDEPENDENT_AMBULATORY_CARE_PROVIDER_SITE_OTHER): Payer: Self-pay | Admitting: Family Medicine

## 2021-08-05 ENCOUNTER — Encounter (INDEPENDENT_AMBULATORY_CARE_PROVIDER_SITE_OTHER): Payer: Self-pay | Admitting: Family Medicine

## 2021-08-06 ENCOUNTER — Encounter (INDEPENDENT_AMBULATORY_CARE_PROVIDER_SITE_OTHER): Payer: Self-pay | Admitting: Family Medicine

## 2021-08-25 ENCOUNTER — Encounter (INDEPENDENT_AMBULATORY_CARE_PROVIDER_SITE_OTHER): Payer: Self-pay | Admitting: Family Medicine

## 2021-09-17 ENCOUNTER — Other Ambulatory Visit (INDEPENDENT_AMBULATORY_CARE_PROVIDER_SITE_OTHER): Payer: Self-pay | Admitting: Family Medicine

## 2021-09-17 DIAGNOSIS — I10 Essential (primary) hypertension: Secondary | ICD-10-CM

## 2021-09-17 MED ORDER — HYDRALAZINE HCL 50 MG PO TABS
50.0000 mg | ORAL_TABLET | Freq: Three times a day (TID) | ORAL | 3 refills | Status: DC
Start: 2021-09-17 — End: 2021-11-04

## 2021-09-17 NOTE — Telephone Encounter (Signed)
LOV 03/19/21      Pharmacy doesn't have refills on file.

## 2021-10-29 DEATH — deceased

## 2021-11-03 ENCOUNTER — Telehealth: Payer: Self-pay

## 2021-11-03 NOTE — Telephone Encounter (Signed)
[facility] Social Work Case Manager  11/03/2021    Social worker received incoming e-mail from Rachel Bo with [facility] informing that patient passed away on Oct 30, 2021.    Chauncy Lean, MSW  Social Work Case Production designer, theatre/television/film I  [employer]  [phone]
